# Patient Record
Sex: Female | Born: 1970 | Race: White | Hispanic: No | Marital: Married | Smoking: Never smoker
Health system: Southern US, Community
[De-identification: ages and names within clinical notes are randomized; demographics above are authoritative.]

## PROBLEM LIST (undated history)

## (undated) DIAGNOSIS — F32A Depression, unspecified: Secondary | ICD-10-CM

## (undated) DIAGNOSIS — R011 Cardiac murmur, unspecified: Secondary | ICD-10-CM

## (undated) DIAGNOSIS — T8859XA Other complications of anesthesia, initial encounter: Secondary | ICD-10-CM

## (undated) DIAGNOSIS — F329 Major depressive disorder, single episode, unspecified: Secondary | ICD-10-CM

## (undated) DIAGNOSIS — D61818 Other pancytopenia: Principal | ICD-10-CM

## (undated) DIAGNOSIS — G610 Guillain-Barre syndrome: Secondary | ICD-10-CM

## (undated) DIAGNOSIS — T4145XA Adverse effect of unspecified anesthetic, initial encounter: Secondary | ICD-10-CM

## (undated) HISTORY — DX: Major depressive disorder, single episode, unspecified: F32.9

## (undated) HISTORY — DX: Other pancytopenia: D61.818

## (undated) HISTORY — DX: Guillain-Barre syndrome: G61.0

## (undated) HISTORY — PX: TUBAL LIGATION: SHX77

## (undated) HISTORY — DX: Depression, unspecified: F32.A

## (undated) HISTORY — PX: WISDOM TOOTH EXTRACTION: SHX21

## (undated) HISTORY — PX: OTHER SURGICAL HISTORY: SHX169

## (undated) HISTORY — DX: Cardiac murmur, unspecified: R01.1

---

## 1998-04-18 ENCOUNTER — Encounter: Payer: Self-pay | Admitting: Internal Medicine

## 1999-06-19 ENCOUNTER — Encounter: Payer: Self-pay | Admitting: Neurological Surgery

## 1999-06-19 ENCOUNTER — Ambulatory Visit (HOSPITAL_COMMUNITY): Admission: RE | Admit: 1999-06-19 | Discharge: 1999-06-19 | Payer: Self-pay | Admitting: Neurological Surgery

## 2000-04-03 ENCOUNTER — Other Ambulatory Visit: Admission: RE | Admit: 2000-04-03 | Discharge: 2000-04-03 | Payer: Self-pay | Admitting: *Deleted

## 2000-10-20 ENCOUNTER — Inpatient Hospital Stay (HOSPITAL_COMMUNITY): Admission: AD | Admit: 2000-10-20 | Discharge: 2000-10-24 | Payer: Self-pay | Admitting: Obstetrics and Gynecology

## 2000-10-20 ENCOUNTER — Encounter (INDEPENDENT_AMBULATORY_CARE_PROVIDER_SITE_OTHER): Payer: Self-pay | Admitting: Specialist

## 2001-03-31 ENCOUNTER — Encounter: Payer: Self-pay | Admitting: Internal Medicine

## 2002-01-11 ENCOUNTER — Other Ambulatory Visit: Admission: RE | Admit: 2002-01-11 | Discharge: 2002-01-11 | Payer: Self-pay | Admitting: Obstetrics and Gynecology

## 2002-11-05 ENCOUNTER — Encounter: Payer: Self-pay | Admitting: Internal Medicine

## 2004-06-12 ENCOUNTER — Other Ambulatory Visit: Admission: RE | Admit: 2004-06-12 | Discharge: 2004-06-12 | Payer: Self-pay | Admitting: Obstetrics and Gynecology

## 2004-07-26 ENCOUNTER — Ambulatory Visit (HOSPITAL_COMMUNITY): Admission: RE | Admit: 2004-07-26 | Discharge: 2004-07-26 | Payer: Self-pay | Admitting: Obstetrics and Gynecology

## 2004-08-21 ENCOUNTER — Ambulatory Visit (HOSPITAL_COMMUNITY): Admission: RE | Admit: 2004-08-21 | Discharge: 2004-08-21 | Payer: Self-pay | Admitting: Obstetrics and Gynecology

## 2004-12-17 IMAGING — US US AMNIOCENTESIS
1 series · 4 of 4 positions shown · non-contrast
Comparison: none

[Series 1: us amniocentesis · 4 of 4 slices shown]
[im 1/4]
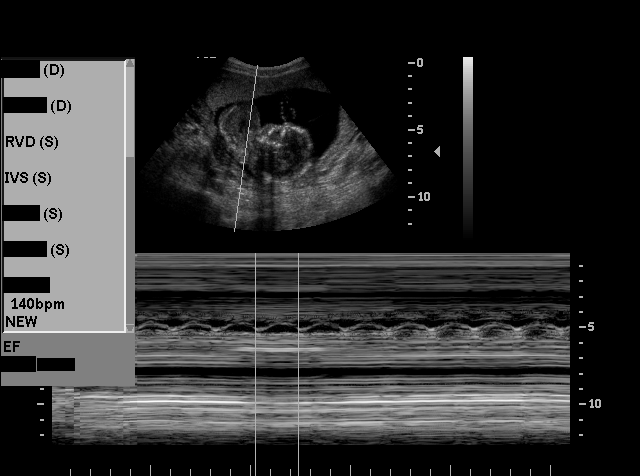
[im 2/4]
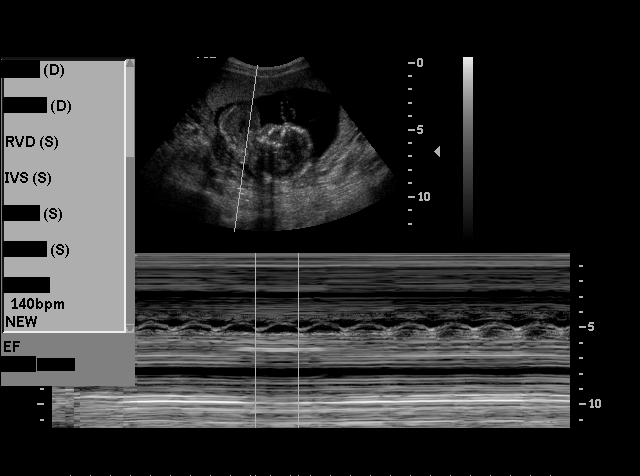
[im 3/4]
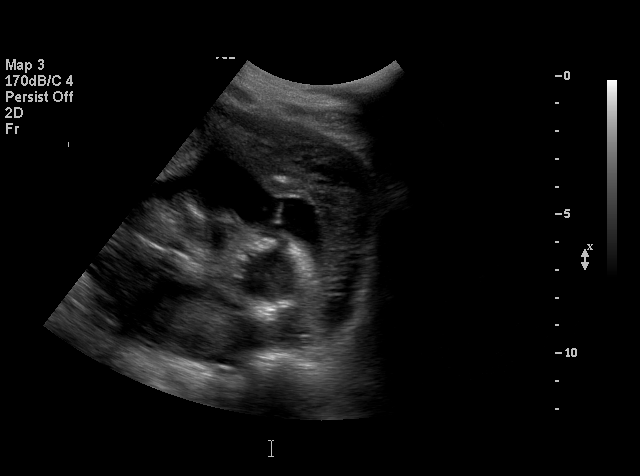
[im 4/4]
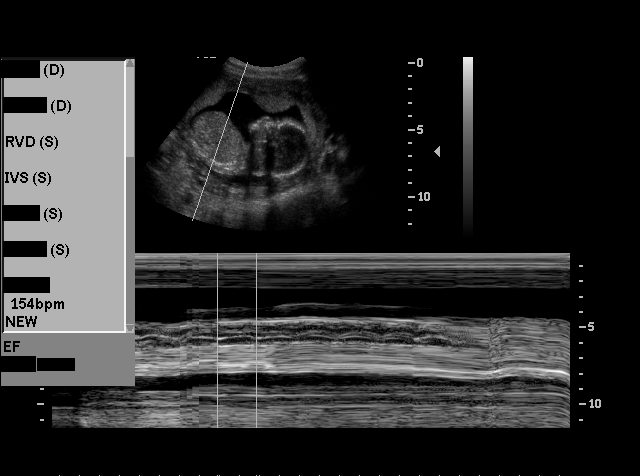

[4 of 4 positions shown; findings below may reference images not displayed]

ULTRASOUND AMNIOCENTESIS
 Ultrasound was utilized to perform amniocentesis by the requesting physician.

## 2004-12-20 ENCOUNTER — Inpatient Hospital Stay (HOSPITAL_COMMUNITY): Admission: AD | Admit: 2004-12-20 | Discharge: 2004-12-23 | Payer: Self-pay | Admitting: Obstetrics and Gynecology

## 2004-12-20 ENCOUNTER — Encounter (INDEPENDENT_AMBULATORY_CARE_PROVIDER_SITE_OTHER): Payer: Self-pay | Admitting: *Deleted

## 2005-01-12 IMAGING — US US OB DETAIL+14 WK
1 series · 13 of 28 positions shown · non-contrast
Comparison: none

CLINICAL DATA: Advanced maternal age.

[Series 1: unknown · 0.30mm/px · 13 of 86 slices shown]
[im 4/86]
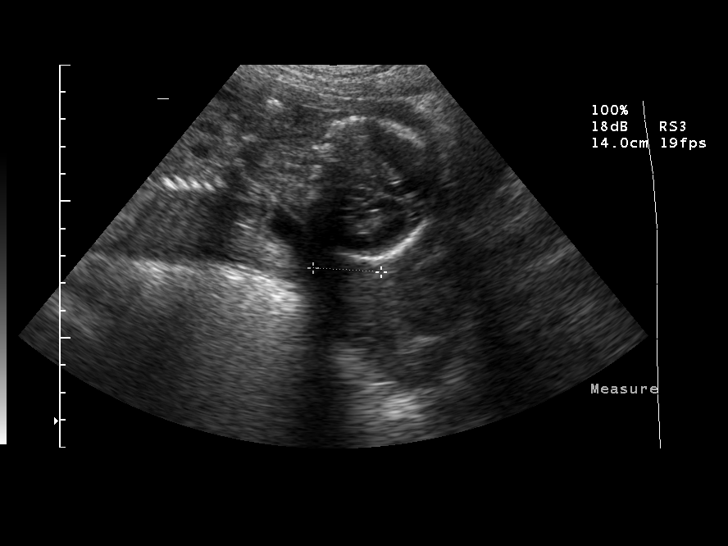
[im 10/86]
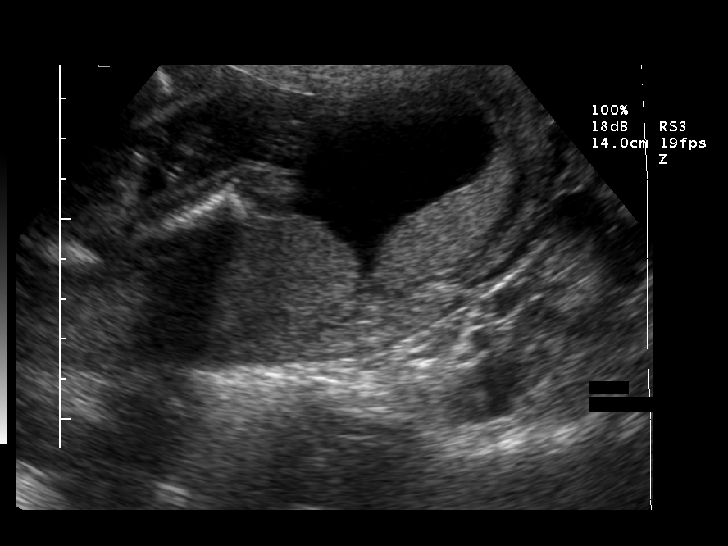
[im 16/86]
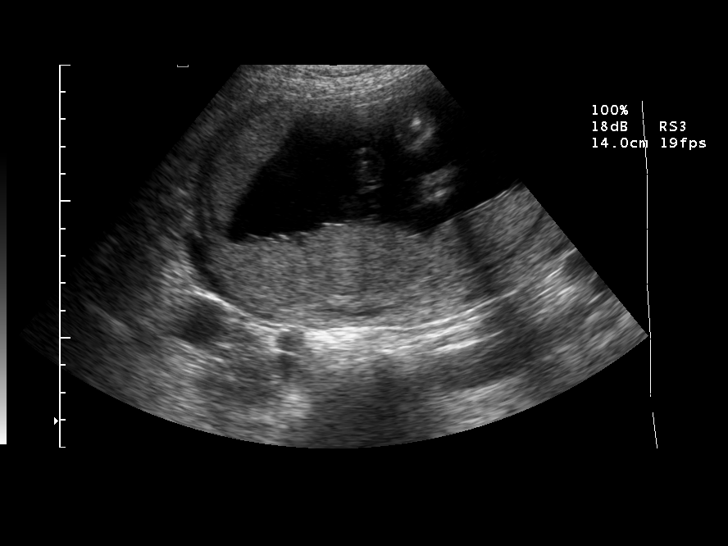
[im 23/86]
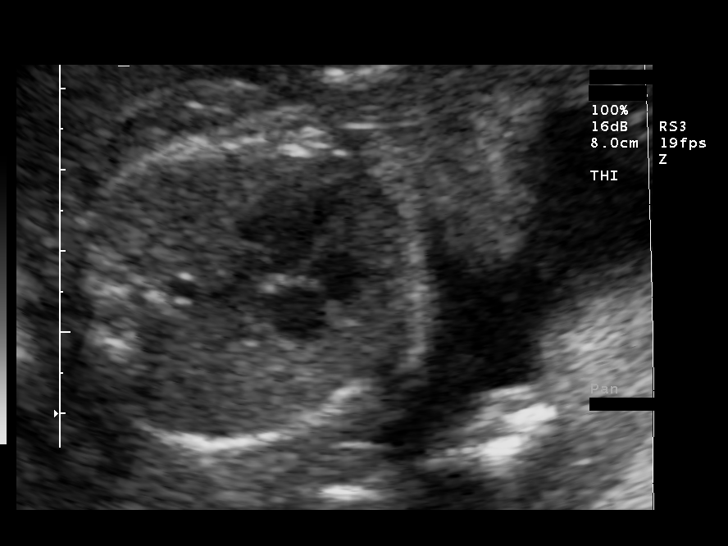
[im 29/86]
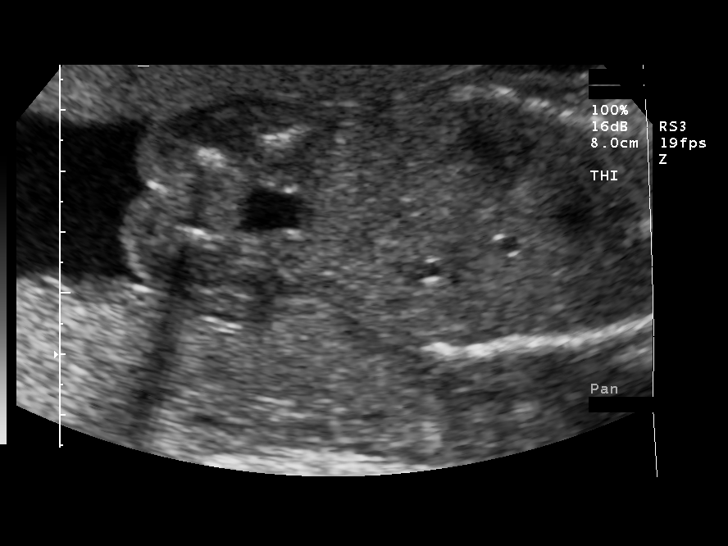
[im 35/86]
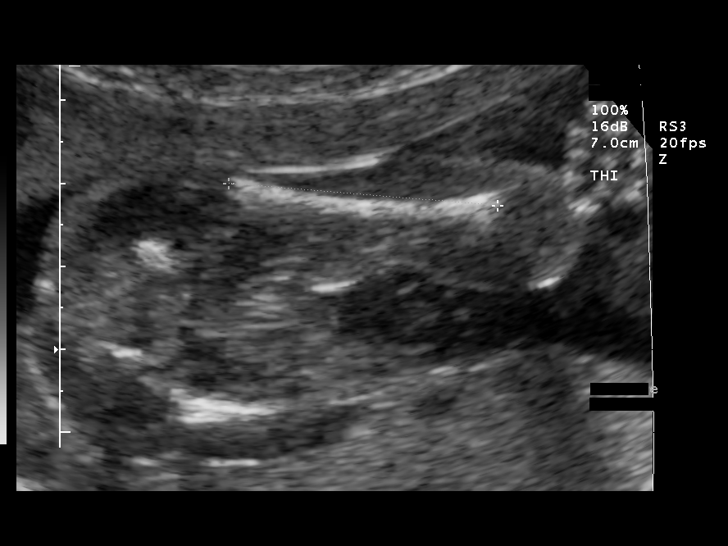
[im 45/86]
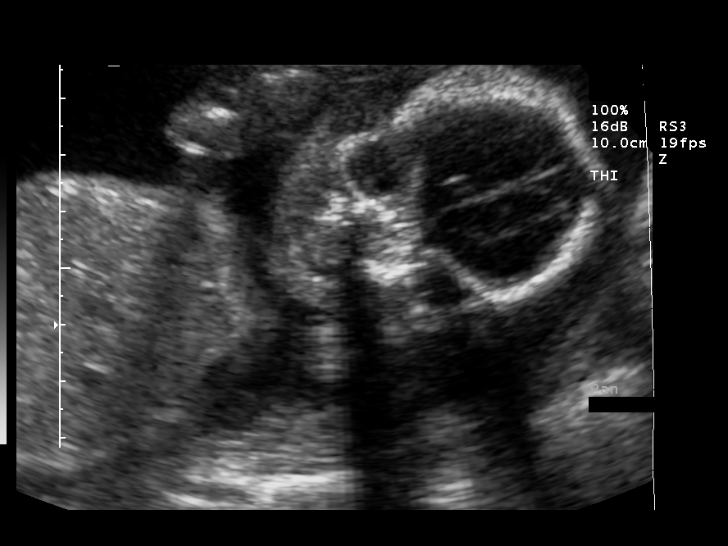
[im 51/86]
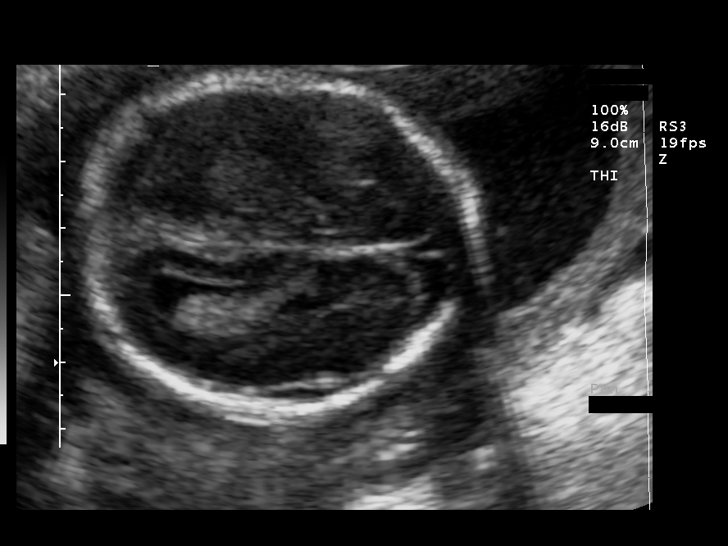
[im 57/86]
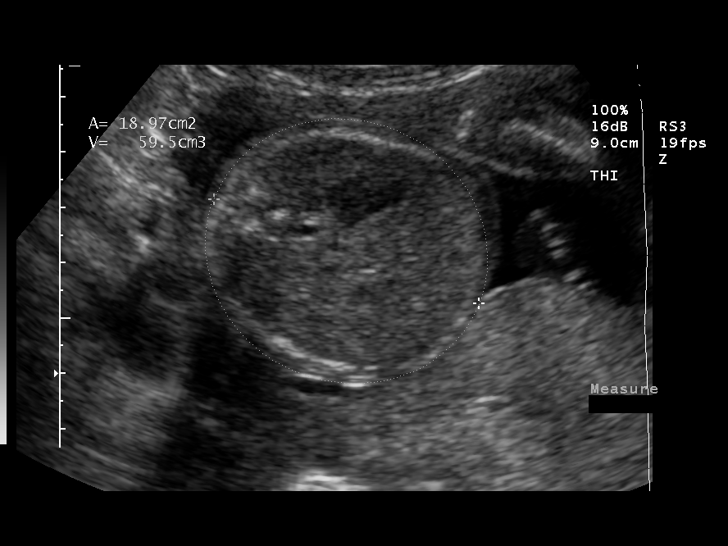
[im 63/86]
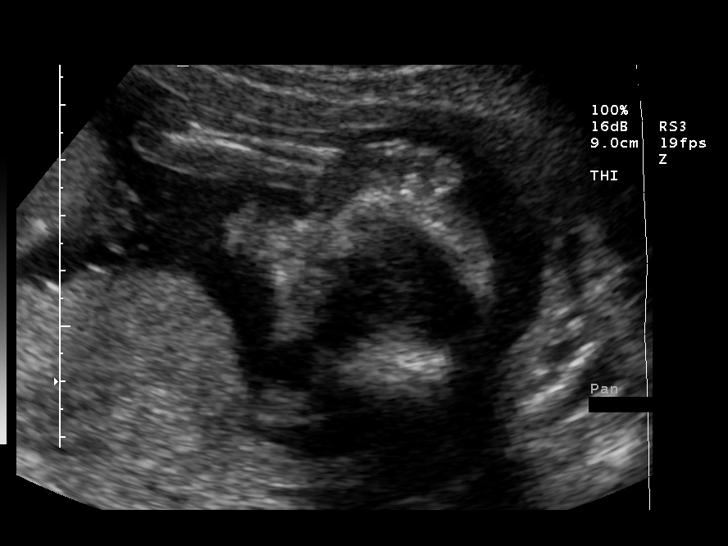
[im 70/86]
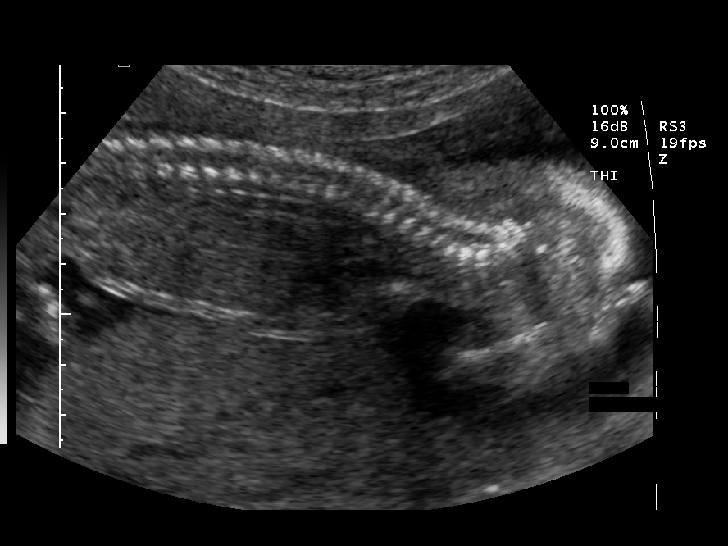
[im 76/86]
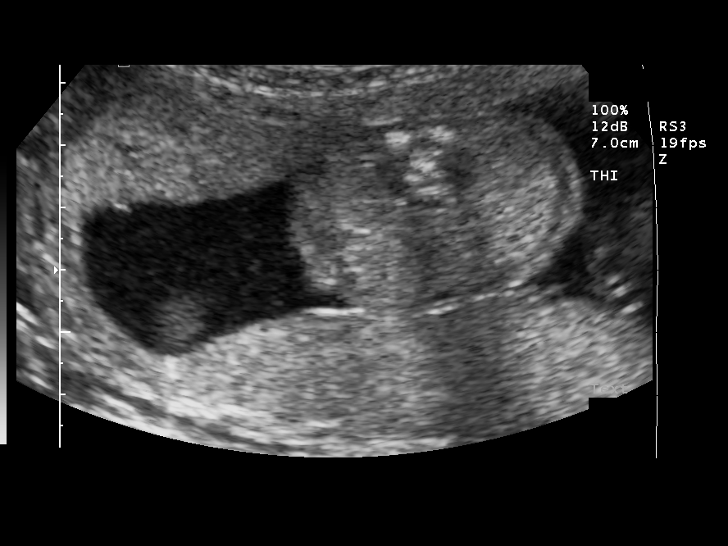
[im 82/86]
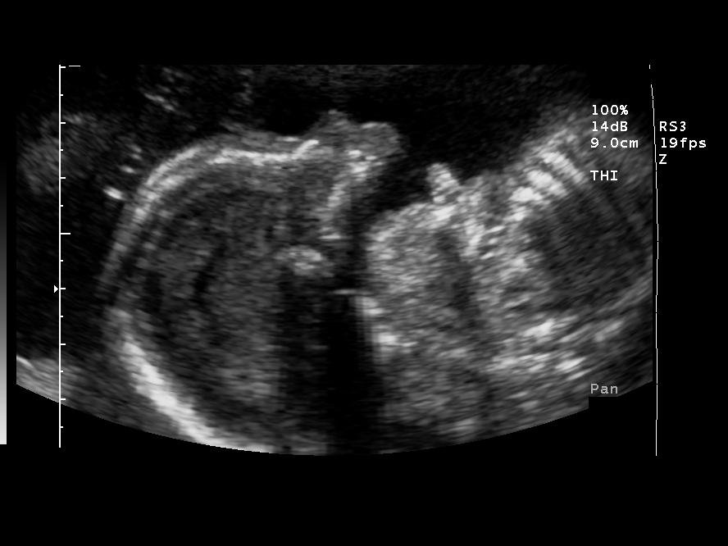

[13 of 28 positions shown; findings below may reference images not displayed]

DETAILED OBSTETRICAL ULTRASOUND 

Number of Fetuses:  1
Heart Rate:  150
Movement:  Yes
Breathing:  No
Presentation:  Cephalic
Placental Location:  Posterior
Grade:  I
Previa:  No
Amniotic Fluid (Subjective):  Normal
Amniotic Fluid (Objective):  4.4 cm Vertical pocket 

FETAL BIOMETRY
BPD:  4.9 cm   21 w 0 d
HC:  18.1 cm   20 w 4 d
AC:  15.5 cm   20 w 5 d
FL:  3.3 cm   20 w 1 d
HL:  3.1 cm   20 w 3 d

MEAN GA:  20 w 4 d

FETAL ANATOMY
Lateral Ventricles:  Visualized 
Thalami/CSP:  Visualized 
Posterior Fossa:  Visualized 
Nuchal Region:  Visualized 
Spine:  Visualized 
4 Chamber Heart on Left:  Visualized 
Stomach on Left:  Visualized 
3 Vessel Cord:  Visualized 
Cord Insertion Site:  Visualized 
Kidneys:  Visualized 
Bladder:  Visualized 
Extremities:  Visualized 

ADDITIONAL ANATOMY VISUALIZED:  LVOT, RVOT, upper lip, orbits, diaphragm, heel, 5th digit, ductal arch, male genitalia and nasal bone.

MATERNAL FINDINGS
Cervix:  3.4 cm Transabdominally
IMPRESSION: Single living intrauterine fetus in cephalic presentation with subjectively normal amniotic fluid volume.  Mean gestational age by today?s ultrasound is 20 weeks 4 days which correlates closely with the reported assigned gestational age by LMP.
Discrete placental lobe noted along the left side of the uterus, consistent with a succinturiate lobe.
Visualized fetal anatomy is unremarkable although a discrete profile view could not be obtained secondary to the fetal chin being against the chest.  The nasal bone was visualized.  

</u12:p>

## 2005-06-05 ENCOUNTER — Ambulatory Visit (HOSPITAL_COMMUNITY): Admission: RE | Admit: 2005-06-05 | Discharge: 2005-06-05 | Payer: Self-pay | Admitting: Obstetrics and Gynecology

## 2005-07-25 ENCOUNTER — Other Ambulatory Visit: Admission: RE | Admit: 2005-07-25 | Discharge: 2005-07-25 | Payer: Self-pay | Admitting: Obstetrics and Gynecology

## 2006-08-14 ENCOUNTER — Other Ambulatory Visit: Admission: RE | Admit: 2006-08-14 | Discharge: 2006-08-14 | Payer: Self-pay | Admitting: Obstetrics and Gynecology

## 2007-04-17 ENCOUNTER — Ambulatory Visit: Payer: Self-pay | Admitting: Internal Medicine

## 2007-08-13 DIAGNOSIS — Z8669 Personal history of other diseases of the nervous system and sense organs: Secondary | ICD-10-CM | POA: Insufficient documentation

## 2007-12-31 LAB — CONVERTED CEMR LAB: Pap Smear: NORMAL

## 2008-08-03 ENCOUNTER — Ambulatory Visit: Payer: Self-pay | Admitting: Internal Medicine

## 2008-08-03 LAB — CONVERTED CEMR LAB
ALT: 41 units/L — ABNORMAL HIGH (ref 0–35)
AST: 30 units/L (ref 0–37)
Albumin: 4 g/dL (ref 3.5–5.2)
Alkaline Phosphatase: 42 units/L (ref 39–117)
BUN: 12 mg/dL (ref 6–23)
Basophils Absolute: 0 10*3/uL (ref 0.0–0.1)
Basophils Relative: 1.1 % (ref 0.0–3.0)
Bilirubin Urine: NEGATIVE
Bilirubin, Direct: 0.1 mg/dL (ref 0.0–0.3)
Blood in Urine, dipstick: NEGATIVE
CO2: 29 meq/L (ref 19–32)
Calcium: 9.1 mg/dL (ref 8.4–10.5)
Chloride: 105 meq/L (ref 96–112)
Cholesterol: 132 mg/dL (ref 0–200)
Creatinine, Ser: 0.7 mg/dL (ref 0.4–1.2)
Eosinophils Absolute: 0.1 10*3/uL (ref 0.0–0.7)
Eosinophils Relative: 1.9 % (ref 0.0–5.0)
GFR calc Af Amer: 120 mL/min
GFR calc non Af Amer: 99 mL/min
Glucose, Bld: 84 mg/dL (ref 70–99)
Glucose, Urine, Semiquant: NEGATIVE
HCT: 37.7 % (ref 36.0–46.0)
HDL: 41.4 mg/dL (ref >39.0)
Hemoglobin: 13 g/dL (ref 12.0–15.0)
Ketones, urine, test strip: NEGATIVE
LDL Cholesterol: 80 mg/dL (ref 0–99)
Lymphocytes Relative: 35.7 % (ref 12.0–46.0)
MCHC: 34.5 g/dL (ref 30.0–36.0)
MCV: 94.5 fL (ref 78.0–100.0)
Monocytes Absolute: 0.4 10*3/uL (ref 0.1–1.0)
Monocytes Relative: 10.7 % (ref 3.0–12.0)
Neutro Abs: 1.9 10*3/uL (ref 1.4–7.7)
Neutrophils Relative %: 50.6 % (ref 43.0–77.0)
Nitrite: NEGATIVE
Platelets: 183 10*3/uL (ref 150–400)
Potassium: 4.1 meq/L (ref 3.5–5.1)
Protein, U semiquant: NEGATIVE
RBC: 3.99 M/uL (ref 3.87–5.11)
RDW: 12.2 % (ref 11.5–14.6)
Sodium: 141 meq/L (ref 135–145)
Specific Gravity, Urine: 1.01
TSH: 3.85 microintl units/mL (ref 0.35–5.50)
Total Bilirubin: 0.9 mg/dL (ref 0.3–1.2)
Total CHOL/HDL Ratio: 3.2
Total Protein: 7 g/dL (ref 6.0–8.3)
Triglycerides: 52 mg/dL (ref 0–149)
Urobilinogen, UA: 0.2
VLDL: 10 mg/dL (ref 0–40)
WBC Urine, dipstick: NEGATIVE
WBC: 3.7 10*3/uL — ABNORMAL LOW (ref 4.5–10.5)
pH: 5.5

## 2008-08-17 ENCOUNTER — Ambulatory Visit: Payer: Self-pay | Admitting: Internal Medicine

## 2008-08-17 DIAGNOSIS — F329 Major depressive disorder, single episode, unspecified: Secondary | ICD-10-CM | POA: Insufficient documentation

## 2010-09-11 ENCOUNTER — Ambulatory Visit (HOSPITAL_COMMUNITY): Admission: RE | Admit: 2010-09-11 | Discharge: 2010-09-11 | Payer: Self-pay | Admitting: Obstetrics and Gynecology

## 2011-01-31 NOTE — Assessment & Plan Note (Signed)
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  History of Present Illness: feels well no complaints has had some depression---meds working    Prior Medications Reviewed Using: Patient Recall  Updated Prior Medication List: MIRTAZAPINE 30 MG  TABS (MIRTAZAPINE) Take 1 tablet by mouth once a day  Current Allergies (reviewed today): No known allergies   Past Medical History:    guillan Barre'    Depression  Past Surgical History:    Caesarean section    Schwanoma   Family History:    Family History of Melanoma--Dad    father-alive    mother- alive and well      Social History:    Married    Never Smoked    2 kids healthy        Occupation:---homemaker   Risk Factors:  PAP Smear History:     Date of Last PAP Smear:  12/31/2007    Results:  normal-pt's report    Review of Systems       no other complaints in a complete ROS      Impression & Recommendations:  Problem # 1:  PREVENTIVE HEALTH CARE (ICD-V70.0) helath maintenance UTD  Complete Medication List: 1)  Mirtazapine 30 Mg Tabs (Mirtazapine) .... Take 1 tablet by mouth once a day    ]Physical Exam General Appearance: well developed, well nourished, no acute distress Eyes: conjunctiva and lids normal, PERRL, EOMI,  Ears, Nose, Mouth, Throat: TM clear, nares clear, oral exam WNL Neck: supple, no lymphadenopathy, no thyromegaly, no JVD Respiratory: clear to  auscultation and percussion, respiratory effort normal Cardiovascular: regular rate and rhythm, S1-S2, no murmur, rub or gallop, no bruits, peripheral pulses normal and symmetric, no cyanosis, clubbing, edema or varicosities Chest: no scars, masses, tenderness; no asymmetry, skin changes, nipple discharge   Gastrointestinal: soft, non-tender; no hepatosplenomegaly, masses; active bowel sounds all quadrants,  Lymphatic: no cervical, axillary or inguinal adenopathy Musculoskeletal: gait normal, muscle tone and strength WNL, no joint swelling, effusions, discoloration, crepitus  Skin: clear, good turgor, color WNL, no rashes, lesions, or ulcerations Neurologic: normal mental status, normal reflexes, normal strength, sensation, and motion Psychiatric: alert; oriented to person, place and time Other Exam:

## 2011-05-17 NOTE — Discharge Summary (Signed)
[facility] [place]  Patient:    Makayla Taylor, Makayla Taylor                        MRN: [id number] Adm. Date:  [id number] Disc. Date: 10/24/00 Attending:  Cleatrice Burke DictatorVance Gather Duplantis, C.N.M.                           Discharge Summary  ADMISSION DIAGNOSES:          1. Intrauterine pregnancy at term.                               2. Spontaneous rupture of membranes.                               3. Early labor.                               4. Group B streptococcus negative.                               5. Rubella nonimmune.  DISCHARGE DIAGNOSES:          1. Intrauterine pregnancy at term.                               2. Spontaneous rupture of membranes.                               3. Early labor.                               4. Group B streptococcus negative.                               5. Rubella nonimmune.                               6. Breast feeding.                               7. Status post arrested second stage of labor                                  with successful vacuum-assisted delivery.                               8. Postpartum hemorrhage.                               9. Anemia.  PROCEDURES THIS ADMISSION:    Vacuum-assisted vaginal delivery for delivery of a viable female infant named Sherron Monday who weighed 9 pounds 1 ounce and had Apgars of 8 and 9 on October 22, 2000.  She was  delivered by Janine Limbo, M.D.  HOSPITAL COURSE:              Ms. Kurka is a 40 year old, married, white female, gravida 1, para 0, at 39-3/7 weeks, who presented with spontaneous rupture of membranes with clear fluid and minimal labor.  She eventually was started on Pitocin augmentation of labor and progressed slowly to completely dilated after about 24 hours of Pitocin.  She then started pushing and pushed for at least three hours and had brought the vertex down to about a +2 station whereupon she was exhausted and the options of vacuum  assistance versus forceps were reviewed with the patient.  Janine Limbo, M.D., was consulted to assist the patient and recommended proceeding with a vacuum-assisted delivery, which the patient and husband agreed to do.  She was then delivered by Janine Limbo, M.D., on October 22, 2000, of a viable female infant named Sherron Monday who weighed 9 pounds 1 ounce and had Apgars of 8 and 9.  Please see the delivery note for details.  Postoperatively, she has done well.  She is ambulating, voiding, and eating without difficulty.  She has no syncope or dizziness.  She is breast-feeding without difficulty.  She is undecided regarding contraception at this time.  DISPOSITION:                  She is deemed ready for discharge today.  DISCHARGE INSTRUCTIONS:       As per the [facility] handout.  DISCHARGE MEDICATIONS:        1. Motrin 600 mg p.o. q.6h. p.r.n. for pain.                               2. Hemocyte one p.o. b.i.d.                               3. Tylox one to two p.o. q.4-6h. p.r.n. for                                  pain.  DISCHARGE FOLLOW-UP:          Will be at [facility] in approximately six weeks or p.r.n.  DISCHARGE LABORATORIES:       Her hemoglobin is 6.8, her platelets are 104, and her WBC count if 15.2. DD:  10/24/00 TD:  10/24/00 Job: [id number]

## 2011-05-17 NOTE — Op Note (Signed)
[facility] [place]  Patient:    Makayla Taylor, Makayla Taylor                        MRN: [id number] Proc. Date: 10/22/00 Adm. Date:  [id number] Attending:  Cleatrice Burke                           Operative Report  PREOPERATIVE DIAGNOSIS:       1. Term intrauterine pregnancy.                               2. Arrested second stage of labor.  POSTOPERATIVE DIAGNOSIS:      1. Term intrauterine pregnancy.                               2. Arrested second stage of labor.                               3. Nuchal cord.                               4. Partial third degree midline laceration.                               5. Second degree right sulcus laceration.                               6. Postpartum hemorrhage.                               7. Hypotension.  OPERATION:                    1. Vacuum extraction vaginal delivery.                               2. Repair of third degree midline laceration.                               3. Repair of second degree right sulcus                                  laceration  OBSTETRICIAN:                 Janine Limbo, M.D.  FIRST ASSISTANT:              Miguel Dibble, C.N.M.  ANESTHESIA:                   Epidural and local Xylocaine.  DISPOSITION:                  [name] is a 40 year old female, gravida 1, para 0, who presented to labor and delivery on October 20, 2000, with premature rupture of membranes and early labor.  The patient had a very slow progression of her labor.  Her labor was augmented.  The patient became complete dilated on the morning of October 22, 2000.  The patient pushed for greater than 2 hours.  She began to tire because of her long labor and from pushing.  I reviewed the options for management with the patient which included observation only, continued pushing, operative vaginal delivery, and cesarean delivery.  The risks and benefits of each of those options were discussed.  The patient  and her husband elected to proceed with vacuum extraction vaginal delivery.  The specific risks of vacuum extraction vaginal delivery were reviewed including, but not limited to, caput formation, hematoma formation, and the rare risk of intracranial bleeding.  We also discussed the fact that it was possible that the vacuum extraction would not be successful and that we would still need to proceed with cesarean delivery.  FINDINGS:                     The weight of the infant is currently not known. A female infant was delivered, named Alan Ripper, with Apgars at 8 at 1 minute and 9 at 5 minutes.  There was a partial third degree midline laceration noted. There was a second degree right sulcus laceration that extended to within 4 cm of the cervix.  The patient had a significant amount of uterine bleeding following her procedure.  There was a nuchal cord present.  The placenta had an accessory lobe.  DESCRIPTION OF PROCEDURE:     The patient was placed in a lithotomy position. The perineum and vagina were prepped with Betadine.  The patient had emptied her bladder while she pushed.  Therefore, the bladder was not catheterized. The fetal head was at a +2 station.  The orientation was occiput anterior. The cervix was completely dilated and 100% effaced.  The Kiwi vacuum extractor was applied.  The patient was allowed to push, and she delivered the fetal head. No popoffs occurred.  The mouth and nose were suction.  A nuchal cord was present.  The cord was reduced.  The remainder of the infant was then delivered.  The cord was clamped and cut.  The infant was placed on the mothers abdomen for bonding.  The patient was noted to have significant bleeding following delivery of the baby.  The placenta was then delivered. The fundus was massaged, and bleeding was noted to slow down.  It did not completely resolve, however.  The patient was given 1 ampule of Hemabate (15-methyl prostaglandin F2a).   Several additional episodes of uterine massage was needed before the uterus would stop bleeding.  The upper vagina was inspected, and the patient was noted to have a second degree sulcus laceration on the right.  An assistant helped with retraction of the vagina so that we could visualize the lacerations.  The right sulcus laceration was repaired using a running locking suture of 2-0 Vicryl.  The capsule of the sphincter ani was then repaired using figure-of-eight sutures of 2-0 Vicryl.  The support fascia of the perineum was then reinforced using figure-of-eight sutures of 2-0 Vicryl.  The vaginal laceration was then repaired using a running suture of 3-0 Vicryl.  The placenta was sent to pathology for evaluation.  The patient was told that she will need assistance as she ambulates. We will check a hematocrit and hemoglobin at 3 oclock. We will obtain orthostatic blood pressures and pulses.  The patient and her husband understand that she will need assistance as she ambulates.  She  understands that we may need to transfuse her if she is unable to care for herself or her baby.  The risks and benefits of blood transfusion were reviewed including the possible risk of infection.  They understand that that is extremely unlikely. Estimated blood loss was 1000 cc. DD:  10/22/00 TD:  10/22/00 Job: [id number]

## 2011-05-17 NOTE — Discharge Summary (Signed)
NAME:  Makayla Taylor, Makayla Taylor NO.:  [id number]   MEDICAL RECORD NO.:  [id number]          PATIENT TYPE:  INP   LOCATION:  9118                          FACILITY:  [facility]   PHYSICIAN:  Naima A. Dillard, M.D. DATE OF BIRTH:  08-26-1971   DATE OF ADMISSION:  12/20/2004  DATE OF DISCHARGE:  12/23/2004                                 DISCHARGE SUMMARY   ADMISSION DIAGNOSIS:  Intrauterine pregnancy at 37-1/7 weeks.  Premature  rupture of membranes in labor, breech presentation.  Desires sterilization.   PROCEDURE:  Primary low transverse cesarean section and BTL.   DISCHARGE DIAGNOSIS:  All of the above plus postpartum hemorrhage.   HISTORY OF PRESENT ILLNESS:  Makayla Taylor is a 40 year old gravida 2 para 1 who  presented at 37-1/7 weeks with spontaneous rupture of membranes with clear  fluid, regular contractions in early labor with a breech presentation.  She  desired sterilization.  The risks and benefits of primary low transverse  cesarean section and a BTL were discussed with  her by Dr. Dierdre Forth  who then performed primary low transverse cesarean section and BTL on  12/20/04 with the birth of a 7 pound female infant with Apgar scars of 9 at 1  minute and 9 at 5 minutes.  The patient has done well in postoperative  period.  Her hemoglobin on the first postoperative day was 9.5, and she has  remained stable.  Her baby has done well and on this her third postoperative  day she is deemed to be in satisfactory condition for discharge.   Her incision is clean, dry, and intact at the time of discharge.   DISCHARGE INSTRUCTIONS:  Per [facility] handout.   DISCHARGE MEDICATIONS:  1.  Motrin 600 mg p.o. q.6h. p.r.n. pain.  2. Tylox 1-2 p.o. q.3-4h p.r.n.      pain.  3. Prenatal vitamins.   FOLLOW UP:  Discharge followup will be at [facility] in six weeks.     Pecolia Ades   SDM/MEDQ  D:  12/23/2004  T:  12/24/2004  Job:  [id number]

## 2011-05-17 NOTE — Op Note (Signed)
NAME:  Makayla Taylor, Makayla Taylor                           ACCOUNT NO.:  [id number]   MEDICAL RECORD NO.:  [id number]                   PATIENT TYPE:  OUT   LOCATION:  ULT                                  [facility]ILITY:  [facility]   PHYSICIAN:  Hal Morales, M.D.             DATE OF BIRTH:  [birth year]   DATE OF PROCEDURE:  07/26/2004  DATE OF DISCHARGE:                                 OPERATIVE REPORT   PREOPERATIVE DIAGNOSES:  Maternal age of 70, intrauterine pregnancy at 16  weeks.   POSTOPERATIVE DIAGNOSES:  Maternal age of 69, intrauterine pregnancy at 16  weeks.   OPERATION:  Genetic amniocentesis.   ANESTHESIA:  Local.   ESTIMATED BLOOD LOSS:  Less than 10 mL.   COMPLICATIONS:  None.   FINDINGS:  The patient had a posterior placenta and normal amniotic fluid.  The fetus was in variable position. The blood type is O positive.   DESCRIPTION OF PROCEDURE:  The patient was in the supine position on the  examining table of the ultrasound unit.  A preliminary ultrasound was  performed to document adequacy of fluid, position of placenta and position  of the fetus.  An area in the suprapubic region was identified to be free of  fetal part and with normal amniotic fluid. The area was prepped with  multiple layers of Betadine and draped. The area was infiltrated with 1%  Xylocaine.  A 20 gauge Ultraview needle was used to access the amniotic  fluid pocket on the second placement under ultrasound guidance. Blood tinged  fluid was withdrawn which began to clear with the second syringe. 5 mL of  fluid was drawn into the first syringe and then 10 mL into the second  syringe. The placement of the amniocentesis needle was documented. The  amniocentesis needle was removed and the post amniocentesis heart rate was  in the 150 beat per minute range. The patient tolerated the procedure well  and was discharged home with printed instructions from the [facility]  OB[facility]N division of [facility].  The amniotic fluid was  sent to [facility] for analysis.                                               Hal Morales, M.D.    VPH/MEDQ  D:  07/26/2004  T:  07/26/2004  Job:  [id number]

## 2011-05-17 NOTE — H&P (Signed)
NAME:  LITTLE, BASHORE NO.:  [id number]   MEDICAL RECORD NO.:  [id number]          PATIENT TYPE:  INP   LOCATION:  9118                          [facility]ILITY[facility]  [facility]   PHYSICIAN:  Hal Morales, M.D.DATE OF BIRTH:  12-25-1971   DATE OF ADMISSION:  12/20/2004  DATE OF DISCHARGE:                                HISTORY & PHYSICAL   HISTORY OF PRESENT ILLNESS:  Ms. Laury is a 40 year old gravida 2 para 1-0-0-  1 at 37-1/2 weeks who presents with spontaneous rupture of membranes at  approximately 3:30 p.m. with contractions approximately every 3-4 minutes.  Patient has been breech on her last two ultrasounds.  She is also now noted  to be breech on bedside ultrasound.  Fluid leaking is noted and there is  light meconium-stained fluid noted.  Patient is therefore admitted to [facility] for cesarean section secondary to breech in labor.  Pregnancy has been remarkable for (1) postpartum hemorrhage after her last  delivery with succenturiate lobe noted during that pregnancy, (2) mild  sensitivity to Latex gloves, (3) history of postpartum depression, (4)  advanced maternal age with normal amnio, (5) history of macrosomia, (6) had  Guillain-Barre syndrome in 1999 but was resolved, (7) breech on last  ultrasound.   PRENATAL LABORATORIES:  Blood type is O positive, Rh antibody negative.  VDRL nonreactive.  Rubella titer positive.  Hepatitis B surface antigen  negative.  Cystic fibrosis testing was negative.  HIV was nonreactive.  Hemoglobin upon entering the practice was 11.9, platelet count was 153 at  her new OB visit.  Group B strep culture was negative at 36 weeks.  Pap  smear was normal.  EDC of January 06, 2005 was established by last menstrual  period and is in agreement with ultrasound at approximately 18 weeks.   HISTORY OF PRESENT PREGNANCY:  Patient entered care at approximately 10  weeks.  She had an amniocentesis by Dr. Pennie Rushing that showed  normal female  chromosomes.  She had another ultrasound at 20 weeks showing normal growth  and development.  She had a succenturiate lobe noted on that ultrasound.  CBC was done at 26 weeks showing platelets of 134.  She had another  ultrasound at 31 weeks for size less than dates, growth was at the 79th-80th  percentile, and breech presentation.  She another ultrasound at 36 weeks  showing breech again.  She had elected not to proceed with an external  version but to plan a C-section with a tubal should the baby remain breech.   OBSTETRICAL HISTORY:  In 2001 she had a vacuum assisted vaginal birth of a  female infant,weight 9 pounds 1 ounce at 40 weeks.  She was in labor 72  hours.  She had epidural anesthesia.  There was an extra lobe on the  placenta and had increased bleeding, her hemoglobin was 6 after delivery.  She had some postpartum depression after her delivery but required no  medications.   MEDICAL HISTORY:  She stopped Ortho-Novum 1/35 one year ago.  She reports  the usual childhood  illnesses, occasional yeast infection, she has  superficial varicosities.  She had transfusion at age 61 or 1 during surgery  for removal of a cavernous hemangioma and she had another blood transfusion  when she was a teenager but this was an autologous transfusion.  Surgical  history includes at age 61 a cavernous hemangioma removed.  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  She also had Guillain-  Barre syndrome in 1999 which resolved.  Patient has some mild sensitivity to  LATEX GLOVES on exams.  She denies any alcohol, drug, or tobacco use during  this pregnancy.   SOCIAL HISTORY:  Patient is Caucasian of the [place].  She is  married to the father of the baby.  His name is Barnie Del.  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  Her  husband is graduate educated.  He is a Location manager.  She  has been  followed by the certified nurse midwife service [facility]  with physician consultation.  She denies any alcohol, drug, or tobacco use  during this pregnancy.   PHYSICAL EXAMINATION:  VITAL SIGNS:  Vital signs are stable; patient is  afebrile.  HEENT:  Within normal limits.  LUNGS:  Bilateral breath sounds are clear.  HEART:  Heart regular rate and rhythm without murmur.  BREASTS:  Breasts are soft and nontender.  ABDOMEN:  Fundal height is approximately 38 cm, estimated fetal weight is 7-  8 pounds, uterine contractions are every 3 minutes moderate quality.  STERILE SPECULUM EXAM:  Positive fern, positive pooling, positive Nitrazine.  Cervix is 2 cm, 70%, breech, she is at a -2 station, breech position is  verified by bedside ultrasound with the vertex in the right upper quadrant.  Fetal heart rate is reactive with no decelerations.  EXTREMITIES:  Deep tendon reflexes are 2+ without clonus.  There is a trace  edema noted.   IMPRESSION:  1.  Intrauterine pregnancy at 37-1/7 weeks.  2.  Spontaneous rupture of membranes in labor.  3.  Breech presentation.  4.  Desires sterilization.   PLAN:  1.  Admit to [facility] [place] per consult with Dr. Dierdre Forth as attending physician.  2.  Routine physician preop orders.  3.  Risks and benefits of cesarean were reviewed with the patient and her      husband by Dr. Pennie Rushing including infection, bleeding, damage to other      organs, anesthesia complications and also risks of tubal sterilization      were also reviewed including failure of method.  Patient and her husband      seem to understand these risks and benefits and do wish to proceed.     Vick   VLL/MEDQ  D:  12/22/2004  T:  12/22/2004  Job:  [id number]

## 2011-05-17 NOTE — Op Note (Signed)
NAME:  Makayla Taylor, Makayla Taylor NO.:  [id number]   MEDICAL RECORD NO.:  [id number]          PATIENT TYPE:  INP   LOCATION:  9118                          FACILITY:  [facility]   PHYSICIAN:  Hal Morales, M.D.DATE OF BIRTH:  May 04, 1971   DATE OF PROCEDURE:  12/20/2004  DATE OF DISCHARGE:                                 OPERATIVE REPORT   PREOPERATIVE DIAGNOSES:  1.  Intrauterine pregnancy at 37-1/2 weeks.  2.  Spontaneous rupture of membranes.  3.  Breech presentation.  4.  Desire for surgical sterilization.   POSTOPERATIVE DIAGNOSES:  1.  Intrauterine pregnancy at 37-1/2 weeks.  2.  Spontaneous rupture of membranes.  3.  Breech presentation.  4.  Desire for surgical sterilization.  5.  Immediate postpartum hemorrhage.   OPERATION:  1.  Primary low transverse cesarean section.  2.  Bilateral tubal ligation.   ANESTHESIA:  Epidural.   ESTIMATED BLOOD LOSS:  1200 mL.   COMPLICATIONS:  Immediate postpartum hemorrhage, finally controlled with IM  Methergine.   SURGEON:  Hal Morales, M.D.   FIRST ASSISTANT:  Renaldo Reel. Emilee Hero, C.N.M.   FINDINGS:  The patient was delivered of a female infant weighing 7 pounds with  Apgars of 9 and 9 at one and five minutes, respectively.  The uterus, tubes,  and ovaries appeared normal for the gravid state.   PROCEDURE:  The patient was taken to the operating room after appropriate  identification and placed on the operating table.  After assessment of all  of her laboratory data, an epidural anesthetic was placed and she was placed  in the supine position with a left lateral tilt.  The abdomen and perineum  were prepped with multiple layers of Betadine and a latex-free Foley  catheter inserted into the bladder under sterile conditions and connected to  straight drainage.  The abdomen was draped as a sterile field.  After the  assurance of adequate anesthesia, suprapubic subcutaneous infiltration of 20  mL of 0.25% Marcaine was  undertaken.  A suprapubic incision was made and the  abdomen opened in layers.  The peritoneum was entered and the bladder blade  placed.  The uterus was incised approximately 2 cm above the uterovesical  fold and that incision taken laterally on either side.  The infant was  delivered from right sacrum transverse position and after having the nares  and pharynx suctioned and the cord clamped and cut was handed off to the  awaiting pediatricians.  The appropriate cord blood was drawn and the  placenta allowed to separate from the uterus and was removed from the  operative field.  The uterine cavity was inspected to ensure that all  fragments of placental tissue had been removed.  The uterine incision was  then closed with a running interlocking suture of 0 Vicryl.  An imbricating  suture of 0 Vicryl was then placed.  There continued to be significant blood  loss from the uterine cavity, and the uterus remained quite boggy.  Significant uterine massage was undertaken, as was additional Pitocin.  The  uterus remained boggy,  and 0.2 mg of Methergine was given IM with  improvement in the uterine tone.  Copious irrigation was carried out. The  left fallopian tube was identified, followed to its fimbriated end, then  grasped at the isthmic portion and elevated.  A suture of 2-0 chromic was  placed through the mesosalpinx and tied fore and aft on the knuckle of tube.  A second ligature was placed proximal to that.  The intervening knuckle of  tube was incised and the cut ends cauterized.  A similar procedure was  carried out on the opposite side.  Hemostasis was noted to be adequate.  The  abdominal peritoneum was closed with a running suture of 2-0 Vicryl after a  single suture of 2-0 Vicryl had been placed to reapproximate the bladder  flap.  The rectus muscles were reapproximated in the midline with a figure-  of-eight suture of 2-0 Vicryl.  Copious irrigation was carried out and  hemostasis  noted to be adequate.  The rectus fascia was closed with a  running suture of 0 Vicryl, then reinforced on either side of midline with  figure-of-eight sutures of 0 Vicryl.  The subcutaneous tissue was copiously  irrigated and made hemostatic with Bovie cautery.  Skin staples were applied  to the skin incision and a sterile dressing applied.  The patient was taken  from the operating room to the recovery room in satisfactory condition  having tolerated the procedure well, with sponge and instrument counts  correct.  The infant went to the full-term nursery.     Colin Ina   VPH/MEDQ  D:  12/20/2004  T:  12/21/2004  Job:  [id number]

## 2011-10-01 ENCOUNTER — Other Ambulatory Visit (HOSPITAL_COMMUNITY): Payer: Self-pay | Admitting: Obstetrics and Gynecology

## 2011-10-01 DIAGNOSIS — Z1231 Encounter for screening mammogram for malignant neoplasm of breast: Secondary | ICD-10-CM

## 2011-10-09 ENCOUNTER — Ambulatory Visit (HOSPITAL_COMMUNITY)
Admission: RE | Admit: 2011-10-09 | Discharge: 2011-10-09 | Disposition: A | Discharge status: Home / Self Care | Payer: BC Managed Care – PPO | Source: Ambulatory Visit | Attending: Obstetrics and Gynecology | Admitting: Obstetrics and Gynecology

## 2011-10-09 DIAGNOSIS — Z1231 Encounter for screening mammogram for malignant neoplasm of breast: Secondary | ICD-10-CM | POA: Insufficient documentation

## 2012-01-03 ENCOUNTER — Encounter: Payer: Self-pay | Admitting: Family

## 2012-01-03 ENCOUNTER — Ambulatory Visit (INDEPENDENT_AMBULATORY_CARE_PROVIDER_SITE_OTHER): Payer: BC Managed Care – PPO | Admitting: Family

## 2012-01-03 VITALS — BP 110/60 | Temp 98.9°F | Wt 146.0 lb

## 2012-01-03 DIAGNOSIS — R05 Cough: Secondary | ICD-10-CM

## 2012-01-03 DIAGNOSIS — J069 Acute upper respiratory infection, unspecified: Secondary | ICD-10-CM

## 2012-01-03 DIAGNOSIS — R059 Cough, unspecified: Secondary | ICD-10-CM

## 2012-01-03 MED ORDER — FEXOFENADINE-PSEUDOEPHED ER 180-240 MG PO TB24: 1.0000 | ORAL_TABLET | Freq: Every day | ORAL | Status: DC

## 2012-01-03 MED ORDER — FLUTICASONE PROPIONATE 50 MCG/ACT NA SUSP: 2.0000 | Freq: Every day | NASAL | Status: DC

## 2012-01-03 NOTE — Progress Notes (Signed)
  Subjective:    Patient ID: Makayla Taylor, female    DOB: Mar 05, 1971, 41 y.o.   MRN: [id number]  Cough This is a new problem. The current episode started in the past 7 days. The problem has been unchanged. The cough is productive of sputum. Associated symptoms include nasal congestion, postnasal drip, rhinorrhea and a sore throat. The symptoms are aggravated by lying down. She has tried OTC cough suppressant for the symptoms. The treatment provided no relief.      Review of Systems  Constitutional: Positive for fatigue.  HENT: Positive for congestion, sore throat, rhinorrhea, sneezing, postnasal drip and sinus pressure.   Eyes: Negative.   Respiratory: Positive for cough.   Cardiovascular: Negative.   Musculoskeletal: Negative.   Skin: Negative.   Neurological: Negative.   Hematological: Negative.   Psychiatric/Behavioral: Negative.    Past Medical History  Diagnosis Date  . Depression   . Guillain-Barre syndrome     History   Social History  . Marital Status: Married    Spouse Name: N/A    Number of Children: N/A  . Years of Education: N/A   Occupational History  . Not on file.   Social History Main Topics  . Smoking status: Never Smoker   . Smokeless tobacco: Not on file  . Alcohol Use:   . Drug Use:   . Sexually Active:    Other Topics Concern  . Not on file   Social History Narrative  . No narrative on file    Past Surgical History  Procedure Date  . Cesarean section   . Schwanoma     Family History  Problem Relation Age of Onset  . Melanoma Father     No Known Allergies  No current outpatient prescriptions on file prior to visit.    BP 110/60  Temp(Src) 98.9 F (37.2 C) (Oral)  Wt 146 lb (66.225 kg)chart    Objective:   Physical Exam  Constitutional: She is oriented to person, place, and time. She appears well-developed and well-nourished.  HENT:  Right Ear: External ear normal.  Left Ear: External ear normal.  Nose: Nose normal.    Mouth/Throat: Oropharynx is clear and moist.  Neck: Normal range of motion. Neck supple.  Cardiovascular: Normal rate, regular rhythm and normal heart sounds.   Pulmonary/Chest: Effort normal and breath sounds normal.  Musculoskeletal: Normal range of motion.  Neurological: She is alert and oriented to person, place, and time.  Skin: Skin is warm and dry.  Psychiatric: She has a normal mood and affect.          Assessment & Plan:  Assessment:Upper Respiratory Infection, Cough  Plan: Allegra-D once daily and Flonase 2 sprays in each nostril once daily. Rest. Drink plenty of fluids. Call if symptoms worsen or persist. Recheck PRN.

## 2012-01-03 NOTE — Patient Instructions (Signed)

## 2012-01-13 ENCOUNTER — Telehealth: Payer: Self-pay | Admitting: Family Medicine

## 2012-01-13 MED ORDER — AMOXICILLIN 500 MG PO TABS: 1000.0000 mg | ORAL_TABLET | Freq: Two times a day (BID) | ORAL | Status: AC

## 2012-01-13 NOTE — Telephone Encounter (Signed)
Saw Padonda last week. Was told to call if she was not better. Pt states she is actually worse. Any way to call something to [facility]? Thanks!

## 2012-01-13 NOTE — Telephone Encounter (Signed)
Pt aware.

## 2012-01-13 NOTE — Telephone Encounter (Signed)
Please call pt

## 2012-01-29 ENCOUNTER — Telehealth: Payer: Self-pay | Admitting: Internal Medicine

## 2012-01-29 MED ORDER — PREDNISONE 20 MG PO TABS: ORAL_TABLET | ORAL | Status: DC

## 2012-01-29 NOTE — Telephone Encounter (Signed)
Pt called and said that she has finished abx, but still has some symptoms. Pt is req refill of amoxicillin (AMOXIL) 500 MG tablet to [facility].

## 2012-01-29 NOTE — Telephone Encounter (Signed)
Please call in RX for Prednisone 20mg  3 tabs po qam x 3 days, 2 tabs po qam x 3 days, and 1 tab po qam x 3 days. #18, no refill. Does not need  More antibiotics.

## 2012-01-29 NOTE — Telephone Encounter (Signed)
Pt continues productive cough (green). No fever.  Got better for 2-3 days after finishing the antibiotic.  Sinus congestion is back.  Feeling sore in right side under breast from coughing.  Appetite ok.  Is not taking any cough meds or sinus meds.  Uses Flonase only. Asking for stronger antibiotic. No fever.

## 2012-01-29 NOTE — Telephone Encounter (Signed)
Pt aware Rx sent to pharmacy. Advised to call if no better after meds

## 2012-04-07 ENCOUNTER — Ambulatory Visit: Payer: BC Managed Care – PPO | Admitting: Family

## 2012-09-10 ENCOUNTER — Other Ambulatory Visit: Payer: Self-pay | Admitting: Obstetrics and Gynecology

## 2012-09-10 DIAGNOSIS — Z1231 Encounter for screening mammogram for malignant neoplasm of breast: Secondary | ICD-10-CM

## 2012-09-29 LAB — HM MAMMOGRAPHY

## 2012-09-29 LAB — HM PAP SMEAR

## 2012-10-09 ENCOUNTER — Ambulatory Visit (HOSPITAL_COMMUNITY)
Admission: RE | Admit: 2012-10-09 | Discharge: 2012-10-09 | Disposition: A | Discharge status: Home / Self Care | Payer: BC Managed Care – PPO | Source: Ambulatory Visit | Attending: Obstetrics and Gynecology | Admitting: Obstetrics and Gynecology

## 2012-10-09 DIAGNOSIS — Z1231 Encounter for screening mammogram for malignant neoplasm of breast: Secondary | ICD-10-CM | POA: Insufficient documentation

## 2012-10-13 ENCOUNTER — Encounter: Payer: Self-pay | Admitting: Obstetrics and Gynecology

## 2012-11-10 ENCOUNTER — Other Ambulatory Visit (INDEPENDENT_AMBULATORY_CARE_PROVIDER_SITE_OTHER): Payer: BC Managed Care – PPO

## 2012-11-10 DIAGNOSIS — Z0000 Encounter for general adult medical examination without abnormal findings: Secondary | ICD-10-CM

## 2012-11-10 LAB — CBC WITH DIFFERENTIAL/PLATELET
Basophils Absolute: 0 10*3/uL (ref 0.0–0.1)
Basophils Relative: 0.6 % (ref 0.0–3.0)
Eosinophils Absolute: 0.1 10*3/uL (ref 0.0–0.7)
Eosinophils Relative: 1.3 % (ref 0.0–5.0)
HCT: 39.5 % (ref 36.0–46.0)
Hemoglobin: 13.1 g/dL (ref 12.0–15.0)
Lymphocytes Relative: 33.7 % (ref 12.0–46.0)
Lymphs Abs: 1.5 10*3/uL (ref 0.7–4.0)
MCHC: 33.2 g/dL (ref 30.0–36.0)
MCV: 95.7 fl (ref 78.0–100.0)
Monocytes Absolute: 0.4 10*3/uL (ref 0.1–1.0)
Monocytes Relative: 8 % (ref 3.0–12.0)
Neutro Abs: 2.6 10*3/uL (ref 1.4–7.7)
Neutrophils Relative %: 56.4 % (ref 43.0–77.0)
Platelets: 154 10*3/uL (ref 150.0–400.0)
RBC: 4.13 Mil/uL (ref 3.87–5.11)
RDW: 13.4 % (ref 11.5–14.6)
WBC: 4.5 10*3/uL (ref 4.5–10.5)

## 2012-11-10 LAB — HEPATIC FUNCTION PANEL
ALT: 18 U/L (ref 0–35)
AST: 21 U/L (ref 0–37)
Albumin: 3.7 g/dL (ref 3.5–5.2)
Alkaline Phosphatase: 34 U/L — ABNORMAL LOW (ref 39–117)
Bilirubin, Direct: 0.1 mg/dL (ref 0.0–0.3)
Total Bilirubin: 0.8 mg/dL (ref 0.3–1.2)
Total Protein: 6.6 g/dL (ref 6.0–8.3)

## 2012-11-10 LAB — BASIC METABOLIC PANEL
BUN: 10 mg/dL (ref 6–23)
CO2: 28 mEq/L (ref 19–32)
Calcium: 8.6 mg/dL (ref 8.4–10.5)
Chloride: 106 mEq/L (ref 96–112)
Creatinine, Ser: 0.7 mg/dL (ref 0.4–1.2)
GFR: 101.81 mL/min (ref >60.00)
Glucose, Bld: 80 mg/dL (ref 70–99)
Potassium: 4.2 mEq/L (ref 3.5–5.1)
Sodium: 140 mEq/L (ref 135–145)

## 2012-11-10 LAB — POCT URINALYSIS DIPSTICK
Bilirubin, UA: NEGATIVE
Blood, UA: NEGATIVE
Glucose, UA: NEGATIVE
Ketones, UA: NEGATIVE
Leukocytes, UA: NEGATIVE
Nitrite, UA: NEGATIVE
Protein, UA: NEGATIVE
Spec Grav, UA: 1.02
Urobilinogen, UA: 0.2
pH, UA: 7

## 2012-11-10 LAB — TSH: TSH: 2.65 u[IU]/mL (ref 0.35–5.50)

## 2012-11-10 LAB — LIPID PANEL
Cholesterol: 128 mg/dL (ref 0–200)
HDL: 55.8 mg/dL (ref >39.00)
LDL Cholesterol: 60 mg/dL (ref 0–99)
Total CHOL/HDL Ratio: 2
Triglycerides: 61 mg/dL (ref 0.0–149.0)
VLDL: 12.2 mg/dL (ref 0.0–40.0)

## 2012-11-17 ENCOUNTER — Encounter: Payer: Self-pay | Admitting: Internal Medicine

## 2012-11-17 ENCOUNTER — Ambulatory Visit (INDEPENDENT_AMBULATORY_CARE_PROVIDER_SITE_OTHER): Payer: BC Managed Care – PPO | Admitting: Internal Medicine

## 2012-11-17 VITALS — BP 114/62 | HR 72 | Temp 98.6°F | Ht 67.25 in | Wt 128.0 lb

## 2012-11-17 DIAGNOSIS — Z0000 Encounter for general adult medical examination without abnormal findings: Secondary | ICD-10-CM

## 2012-11-17 MED ORDER — ACETAZOLAMIDE 125 MG PO TABS: 125.0000 mg | ORAL_TABLET | Freq: Two times a day (BID) | ORAL | Status: DC

## 2012-11-18 NOTE — Progress Notes (Signed)
Patient ID: ARITZEL GEERDES, female   DOB: Jan 06, 1971, 41 y.o.   MRN: [id number] CPX  Past Medical History  Diagnosis Date  . Depression   . Guillain-Barre syndrome     History   Social History  . Marital Status: Married    Spouse Name: N/A    Number of Children: N/A  . Years of Education: N/A   Occupational History  . Not on file.   Social History Main Topics  . Smoking status: Never Smoker   . Smokeless tobacco: Not on file  . Alcohol Use:   . Drug Use:   . Sexually Active:    Other Topics Concern  . Not on file   Social History Narrative  . No narrative on file    Past Surgical History  Procedure Date  . Cesarean section   . Schwanoma     Family History  Problem Relation Age of Onset  . Melanoma Father     No Known Allergies  Current Outpatient Prescriptions on File Prior to Visit  Medication Sig Dispense Refill  . mirtazapine (REMERON) 15 MG tablet Take 15 mg by mouth at bedtime.        Marland Kitchen acetaZOLAMIDE (DIAMOX) 125 MG tablet Take 1 tablet (125 mg total) by mouth 2 (two) times daily. Start one day prior to ascension and take for 3 days  20 tablet  0     patient denies chest pain, shortness of breath, orthopnea. Denies lower extremity edema, abdominal pain, change in appetite, change in bowel movements. Patient denies rashes, musculoskeletal complaints. No other specific complaints in a complete review of systems.   BP 114/62  Pulse 72  Temp 98.6 F (37 C) (Oral)  Ht 5' 7.25" (1.708 m)  Wt 128 lb (58.06 kg)  BMI 19.90 kg/m2  Well-developed well-nourished female in no acute distress. HEENT exam atraumatic, normocephalic, extraocular muscles are intact. Neck is supple. No jugular venous distention no thyromegaly. Chest clear to auscultation without increased work of breathing. Cardiac exam S1 and S2 are regular. Abdominal exam active bowel sounds, soft, nontender. Extremities no edema. Neurologic exam she is alert without any motor sensory deficits. Gait is  normal.  A/p- well visit , health maint UTD

## 2013-04-22 ENCOUNTER — Telehealth: Payer: Self-pay | Admitting: Internal Medicine

## 2013-04-22 NOTE — Telephone Encounter (Signed)
Patient Information: ° Caller Name: Marrisa ° Phone: [phone] ° Patient: Makayla Taylor, Makayla Taylor ° Gender: Female ° DOB: 05/17/1971 ° Age: 42 Years ° PCP: Swords, Bruce (Adults only) ° Pregnant: No ° °Office Follow Up: ° Does the office need to follow up with this patient?: Yes ° Instructions For The Office: Wants Rx called in for cipro krs/can ° °RN Note: ° Patient currently having multiple episodes of diarrhea.  States no blood noted in stool and no mucus/pus.  Would like Rx for cipro called in.  Per diarrhea protocol, emergent symptoms denied; info to office for provider review/Rx/callback.  Uses [facility].  May reach patient at [phone].  krs/can ° °Symptoms ° Reason For Call & Symptoms: Took her usual cipro to a trip to [place], but other members of travelling got ill and they got the cipro.  States on arriving home, she developed diarrhea. ° Reviewed Health History In EMR: Yes ° Reviewed Medications In EMR: Yes ° Reviewed Allergies In EMR: Yes ° Reviewed Surgeries / Procedures: Yes ° Date of Onset of Symptoms: 04/17/2013 °OB / GYN: ° LMP: 04/10/2013 ° °Guideline(s) Used: ° Diarrhea ° °Disposition Per Guideline:  ° Callback by PCP Today ° °Reason For Disposition Reached:  ° Travel to a foreign country in past month ° °Advice Given: ° N/A ° °Patient Will Follow Care Advice: ° YES ° ° °

## 2013-04-22 NOTE — Telephone Encounter (Signed)
Duplicate

## 2013-04-22 NOTE — Telephone Encounter (Signed)
Patient Information:  Caller Name: Lariza  Phone: [phone]  Patient: Makayla Taylor  Gender: Female  DOB: March 18, 1971  Age: 42 Years  PCP: Birdie Sons (Adults only)  Pregnant: No  Office Follow Up:  Does the office need to follow up with this patient?: Yes  Instructions For The Office: Wants Rx called in for cipro krs/can  RN Note:  Patient currently having multiple episodes of diarrhea.  States no blood noted in stool and no mucus/pus.  Would like Rx for cipro called in.  Per diarrhea protocol, emergent symptoms denied; info to office for provider review/Rx/callback.  Uses [facility].  May reach patient at [phone].  krs/can  Symptoms  Reason For Call & Symptoms: Took her usual cipro to a trip to [place], but other members of travelling got ill and they got the cipro.  States on arriving home, she developed diarrhea.  Reviewed Health History In EMR: Yes  Reviewed Medications In EMR: Yes  Reviewed Allergies In EMR: Yes  Reviewed Surgeries / Procedures: Yes  Date of Onset of Symptoms: 04/17/2013 OB / GYN:  LMP: 04/10/2013  Guideline(s) Used:  Diarrhea  Disposition Per Guideline:   Callback by PCP Today  Reason For Disposition Reached:   Travel to a foreign country in past month  Advice Given:  N/A  Patient Will Follow Care Advice:  YES

## 2013-04-23 ENCOUNTER — Telehealth: Payer: Self-pay | Admitting: Internal Medicine

## 2013-04-23 NOTE — Telephone Encounter (Signed)
Per dr Lovell Sheehan needs ov with available md- Left message on machine

## 2013-04-23 NOTE — Telephone Encounter (Signed)
Caller: Makayla Taylor/Patient; Phone: [phone]; Reason for Call: Patient calling in follow up regarding cipro Rx.  States she contacted office x 2 04/22/13 and did not get a call back from staff regarding Rx.  TC to office; advised staff is working on request and will contact patient this afternoon.  May reach patient at [phone].  Krs/can

## 2013-04-23 NOTE — Telephone Encounter (Signed)
Caller: Makayla Taylor/Patient; Phone: [phone]; Reason for Call: Patient calling in follow up to request for cipro, as the Rx she filled for trip to [place] was used by other members of her team while there.  Onset of diarrhea when she returned to [place].  Declines new triage.  Per epic, Dr.  Lovell Sheehan advised appt; unable to come to appt today.  Appt scheduled 04/24/13 1130 at [place] office.  Krs/can

## 2013-04-23 NOTE — Telephone Encounter (Signed)
Per dr Lovell Sheehan. Needs to see available md- Left message on machine for pt

## 2013-04-24 ENCOUNTER — Ambulatory Visit (INDEPENDENT_AMBULATORY_CARE_PROVIDER_SITE_OTHER): Payer: BC Managed Care – PPO | Admitting: Internal Medicine

## 2013-04-24 ENCOUNTER — Encounter: Payer: Self-pay | Admitting: Internal Medicine

## 2013-04-24 VITALS — BP 110/70 | HR 72 | Temp 98.9°F | Wt 136.0 lb

## 2013-04-24 DIAGNOSIS — A09 Infectious gastroenteritis and colitis, unspecified: Secondary | ICD-10-CM

## 2013-04-24 MED ORDER — CIPROFLOXACIN HCL 500 MG PO TABS: 500.0000 mg | ORAL_TABLET | Freq: Two times a day (BID) | ORAL | Status: DC

## 2013-04-24 NOTE — Patient Instructions (Signed)
cipro for 3 days  Contact PCP for advice for further travel  If  persistent or progressive may need more evaluation.   Travelers' Diarrhea Travelers' diarrhea (TD) is the most common illness affecting travelers. Each year many travelers develop diarrhea. TD usually occurs within the first week of travel. However, it may occur at any time while traveling. It may even occur after returning home. The most important risk factor is where you are going. High-risk places are the developing countries of:  [place].  [place].  [place].  [place]. High risk people include young adults and those with:  Transplants.  HIV infections.  Medicine that suppresses the immune system.  Inflammatory-bowel disease.  Diabetes.  H-2 blockers or antacids. Attack rates are similar for men and women. The primary source of TD is eating or drinking food or water tainted with feces (stool or bowel movements). CAUSES  Infectious agents are the primary cause of TD. Germs cause almost 80% of TD cases. The most common germ produces:  Watery diarrhea with cramps.  Low-grade or no fever. There are many other bacterial, viral and parasitic pathogens (disease causing "bugs").  SYMPTOMS  Most TD cases begin suddenly. Symptoms include stool that is increased in:  Frequency.  Volume.  Weight. Altered stool consistency also is common. Typically, you have four to five loose or watery bowel movements each day. Other common symptoms are:  Nausea.  Vomiting.  Diarrhea.  Abdominal cramping.  Bloating.  Fever.  Urgency.  Malaise. Most cases are not dangerous. Most cases go away in 1-2 days without treatment. TD is rarely life threatening. 90% of cases resolve within 1 week. 98% resolve within 1 month. PREVENTION   Avoid foods or beverages purchased from street vendors in high risk countries.  Avoid food from places where unclean conditions are present.  Avoid raw or undercooked meat and  seafood.  Avoid raw fruits (e.g., oranges, bananas, avocados) and vegetables unless you peel them yourself.  If handled properly, well-cooked and packaged foods usually are safe. Foods associated with increased risk for TD include:  Tap water.  Ice.  Unpasteurized milk.  Dairy products.  Safe beverages include:  Bottled carbonated beverages.  Hot tea or coffee.  Beer.  Wine.  Boiled water.  Water treated with iodine or chlorine. ANTIBIOTICS ARE NOT RECOMMENDED AS PREVENTION  CDC (Centers for Disease Control) does not recommend antimicrobial drugs (medicine that kill germs) to prevent TD. Several studies show that Pepto-Bismol taken as either 2 tablets 4 times daily, or 2 fluid ounces 4 times daily, reduces the incidence of travelers' diarrhea. People that should avoid Pepto-Bismol include those who are:  Pregnant.  Allergic to aspirin.  Taking anticoagulants medicine (probenecid, methotrexate).  Be informed about potential side effects, in particular about temporary blackening of the tongue and stool, and rarely ringing in the ears. Because of potential adverse side effects, preventative Pepto-Bismol should not be used for more than 3 weeks.  Some antibiotics taken in a once-a-day dose are 90% effective at preventing travelers' diarrhea. However, antibiotics are not recommended as prevention. Routine antimicrobial prophylaxis increases your risk for:  Adverse reactions.  Infections with resistant organisms.  Antibiotics can increase your susceptibility to resistant bacterial pathogens and provide no protection against either viral or parasitic pathogens. This can give travelers a false sense of security. As a result, strict adherence to preventive measures is encouraged. Pepto-Bismol should be used as an extra effort if prophylaxis is needed. TREATMENT   TD usually is  a self-limited disorder. It gets well without treatment. It often goes away without specific  treatment. Oral re-hydration is often helpful to replace lost fluids and electrolytes. Clear liquids are routinely recommended for adults. You may be helped with antimicrobial therapy if you develop three or more loose stools in an 8-hour period, especially if associated with:  Nausea.  Vomiting.  Abdominal cramps.  Fever.  Blood in stools.  Antibiotics usually are given for 3-5 days. Pepto-Bismol also may be used as treatment. Take one fluid ounce, or two 262 mg tablets every 30 minutes, for up to 8 doses in a 24-hour period. This can be repeated on a second day. If diarrhea persists despite therapy, you should be evaluated by a caregiver and treated for possible parasitic infection.  Because drug resistance is a continuing problem and may vary from country to country, professional assistance should be looked for if problems persist.  Antimotility agents (loperamide, diphenoxylate, and paregoric) mostly reduce diarrhea by slowing down the passage of food and drink in the gut. This allows more time for absorption. Some persons believe diarrhea is the body's defense mechanism to minimize contact time between gut pathogens and lining of the bowel. In several studies, antimotility agents have been useful in treating travelers' diarrhea by decreasing the duration of diarrhea. However, these agents should never be used by persons with fever or bloody diarrhea because they can increase the severity of disease by delaying clearance of causative organisms. Because antimotility agents are now available over the counter, their improper use is of concern. Complications have been reported from the use of these medicines such as:  Toxic megacolon.  Sepsis.  Disseminated intravascular coagulation. SEEK IMMEDIATE MEDICAL CARE IF:   You are unable to keep fluids down.  Vomiting or diarrhea becomes persistent.  Abdominal (belly) pain develops or increases or localizes. (Right sided pain can be  appendicitis and left sided pain in adults can be diverticulitis).  You develop an oral temperature above 102 F (38.9 C), or as your caregiver suggests.  Diarrhea becomes excessive or contains blood or mucous.  Excessive weakness, dizziness, fainting or extreme thirst.  Checking weight 2 to 3 times per day in babies and children will help verify adequate fluid replacement. Your caregiver will tell you what loss should concern you or suggest another visit to your personal physician.  Record your weight or your child's weight today. Compare this to your home scale and record all weights and time and date weighed. Try to check weight at the same times every day. Bring this chart to your caregivers if you or your child needs to be seen again. FOR MORE INFORMATION  Travelers should consult with a caregiver before departing on a trip abroad. Information about TD is available from:  Your local or state health departments.  World Science writer [facility]). Other information that may be of interest to travelers can be found at the [facility] Travelers' Health homepage at [employer]gl. Document Released: 12/06/2002 Document Revised: 03/09/2012 Document Reviewed: 02/23/2009 [facility] Patient Information 2013 [place], [place].

## 2013-04-24 NOTE — Progress Notes (Signed)
Chief Complaint  Patient presents with  . Diarrhea    HPI: Patient comes in today for SDA for  new problem evaluation. Saturday clinic referral in.   Problems with diarrhea for 7- 8 days onset during  travel time coming back fr [place] . Other s also got same  But had rx and better    Had left over cipro and given to others so she didn't take any meds yet and still having sx     Some improvement in past 2 days now 5 x per day frequency  no blood .   No fever. abd cramps after eating.  Not as sick as in past when had t d   No meds for this had waited for this to go away and persisted ROS: See pertinent positives and negatives per HPI. No cp sob chills fever  No cough rash   [place] and next trip June ?   Past Medical History  Diagnosis Date  . Depression   . Guillain-Barre syndrome     Family History  Problem Relation Age of Onset  . Melanoma Father     History   Social History  . Marital Status: Married    Spouse Name: N/A    Number of Children: N/A  . Years of Education: N/A   Social History Main Topics  . Smoking status: Never Smoker   . Smokeless tobacco: None  . Alcohol Use:   . Drug Use:   . Sexually Active:    Other Topics Concern  . None   Social History Narrative  . None    Outpatient Encounter Prescriptions as of 04/24/2013  Medication Sig Dispense Refill  . mirtazapine (REMERON) 15 MG tablet Take 15 mg by mouth at bedtime.        Marland Kitchen acetaZOLAMIDE (DIAMOX) 125 MG tablet Take 1 tablet (125 mg total) by mouth 2 (two) times daily. Start one day prior to ascension and take for 3 days  20 tablet  0  . ciprofloxacin (CIPRO) 500 MG tablet Take 1 tablet (500 mg total) by mouth 2 (two) times daily.  6 tablet  0   No facility-administered encounter medications on file as of 04/24/2013.    EXAM:  BP 110/70  Pulse 72  Temp(Src) 98.9 F (37.2 C) (Oral)  Wt 136 lb (61.689 kg)  BMI 21.15 kg/m2  SpO2 97%  Body mass index is 21.15  kg/(m^2).  GENERAL: vitals reviewed and listed above, alert, oriented, appears well hydrated and in no acute distress  HEENT: Normocephalic ;atraumatic , Eyes;  PERRL, EOMs  Full, lids and conjunctiva clear,,Ears: no deformities, canals nl, TM landmarks normal, Nose: no deformity or discharge  Mouth : OP clear without lesion or edema . 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  Contact PCP about  Future rx  Because she travels  so much.    Disc patient portal and activation code given.  -Patient advised to return or notify health care team  if symptoms worsen or persist or new concerns arise.  Patient Instructions  cipro for 3 days  Contact PCP for advice for further travel  If  persistent or progressive may need more evaluation.   Travelers' Diarrhea Travelers' diarrhea (TD) is the most common illness affecting travelers. Each year many travelers develop diarrhea. TD usually occurs within the first week of travel. However, it may occur at any time while traveling. It may even occur after returning home. The most important risk factor is where you are going. High-risk places are the developing countries of:  [place].  [place].  [place].  [place]. High risk people include young adults and those with:  Transplants.  HIV infections.  Medicine  that suppresses the immune system.  Inflammatory-bowel disease.  Diabetes.  H-2 blockers or antacids. Attack rates are similar for men and women. The primary source of TD is eating or drinking food or water tainted with feces (stool or bowel movements). CAUSES  Infectious agents are the primary cause of TD. Germs cause almost 80% of TD cases. The most common germ produces:  Watery diarrhea with cramps.  Low-grade or no fever. There are many other bacterial, viral and parasitic pathogens (disease causing "bugs").  SYMPTOMS  Most TD cases begin suddenly. Symptoms include stool that is increased in:  Frequency.  Volume.  Weight. Altered stool consistency also is common. Typically, you have four to five loose or watery bowel movements each day. Other common symptoms are:  Nausea.  Vomiting.  Diarrhea.  Abdominal cramping.  Bloating.  Fever.  Urgency.  Malaise. Most cases are not dangerous. Most cases go away in 1-2 days without treatment. TD is rarely life threatening. 90% of cases resolve within 1 week. 98% resolve within 1 month. PREVENTION   Avoid foods or beverages purchased from street vendors in high risk countries.  Avoid food from places where unclean conditions are present.  Avoid raw or undercooked meat and seafood.  Avoid raw fruits (e.g., oranges, bananas, avocados) and vegetables unless you peel them yourself.  If handled properly, well-cooked and packaged foods usually are safe. Foods associated with increased risk for TD include:  Tap water.  Ice.  Unpasteurized milk.  Dairy products.  Safe beverages include:  Bottled carbonated beverages.  Hot tea or coffee.  Beer.  Wine.  Boiled water.  Water treated with iodine or chlorine. ANTIBIOTICS ARE NOT RECOMMENDED AS PREVENTION  CDC (Centers for Disease Control) does not recommend antimicrobial drugs (medicine that kill germs) to prevent TD. Several studies show that Pepto-Bismol taken  as either 2 tablets 4 times daily, or 2 fluid ounces 4 times daily, reduces the incidence of travelers' diarrhea. People that should avoid Pepto-Bismol include those who are:  Pregnant.  Allergic to aspirin.  Taking anticoagulants medicine (probenecid, methotrexate).  Be informed about potential side effects, in particular about temporary blackening of the tongue and stool, and rarely ringing in the ears. Because of potential adverse side effects, preventative Pepto-Bismol should not be used for more than 3 weeks.  Some antibiotics taken in a once-a-day dose are 90% effective at preventing travelers' diarrhea. However, antibiotics are not recommended as prevention. Routine antimicrobial prophylaxis increases your risk for:  Adverse reactions.  Infections with resistant organisms.  Antibiotics can increase your susceptibility to resistant bacterial pathogens and provide no protection against either viral or parasitic pathogens.  This can give travelers a false sense of security. As a result, strict adherence to preventive measures is encouraged. Pepto-Bismol should be used as an extra effort if prophylaxis is needed. TREATMENT   TD usually is a self-limited disorder. It gets well without treatment. It often goes away without specific treatment. Oral re-hydration is often helpful to replace lost fluids and electrolytes. Clear liquids are routinely recommended for adults. You may be helped with antimicrobial therapy if you develop three or more loose stools in an 8-hour period, especially if associated with:  Nausea.  Vomiting.  Abdominal cramps.  Fever.  Blood in stools.  Antibiotics usually are given for 3-5 days. Pepto-Bismol also may be used as treatment. Take one fluid ounce, or two 262 mg tablets every 30 minutes, for up to 8 doses in a 24-hour period. This can be repeated on a second day. If diarrhea persists despite therapy, you should be evaluated by a caregiver and treated  for possible parasitic infection.  Because drug resistance is a continuing problem and may vary from country to country, professional assistance should be looked for if problems persist.  Antimotility agents (loperamide, diphenoxylate, and paregoric) mostly reduce diarrhea by slowing down the passage of food and drink in the gut. This allows more time for absorption. Some persons believe diarrhea is the body's defense mechanism to minimize contact time between gut pathogens and lining of the bowel. In several studies, antimotility agents have been useful in treating travelers' diarrhea by decreasing the duration of diarrhea. However, these agents should never be used by persons with fever or bloody diarrhea because they can increase the severity of disease by delaying clearance of causative organisms. Because antimotility agents are now available over the counter, their improper use is of concern. Complications have been reported from the use of these medicines such as:  Toxic megacolon.  Sepsis.  Disseminated intravascular coagulation. SEEK IMMEDIATE MEDICAL CARE IF:   You are unable to keep fluids down.  Vomiting or diarrhea becomes persistent.  Abdominal (belly) pain develops or increases or localizes. (Right sided pain can be appendicitis and left sided pain in adults can be diverticulitis).  You develop an oral temperature above 102 F (38.9 C), or as your caregiver suggests.  Diarrhea becomes excessive or contains blood or mucous.  Excessive weakness, dizziness, fainting or extreme thirst.  Checking weight 2 to 3 times per day in babies and children will help verify adequate fluid replacement. Your caregiver will tell you what loss should concern you or suggest another visit to your personal physician.  Record your weight or your child's weight today. Compare this to your home scale and record all weights and time and date weighed. Try to check weight at the same times every day.  Bring this chart to your caregivers if you or your child needs to be seen again. FOR MORE INFORMATION  Travelers should consult with a caregiver before departing on a trip abroad. Information about TD is available from:  Your local or state health departments.  World Science writer [facility]). Other information that may be of interest to travelers can be found at the [facility] Travelers' Health homepage at QuestDrive.gl. Document Released: 12/06/2002 Document Revised: 03/09/2012 Document Reviewed: 02/23/2009 [facility] Patient Information 2013 [place], [place].      Neta Mends. Panosh M.D.

## 2013-04-26 ENCOUNTER — Encounter: Payer: Self-pay | Admitting: Internal Medicine

## 2013-11-03 ENCOUNTER — Other Ambulatory Visit: Payer: Self-pay | Admitting: Obstetrics and Gynecology

## 2013-11-03 DIAGNOSIS — Z1231 Encounter for screening mammogram for malignant neoplasm of breast: Secondary | ICD-10-CM

## 2013-11-18 ENCOUNTER — Ambulatory Visit (HOSPITAL_COMMUNITY): Payer: BC Managed Care – PPO

## 2013-12-07 ENCOUNTER — Ambulatory Visit (HOSPITAL_COMMUNITY)
Admission: RE | Admit: 2013-12-07 | Discharge: 2013-12-07 | Disposition: A | Discharge status: Home / Self Care | Payer: BC Managed Care – PPO | Source: Ambulatory Visit | Attending: Obstetrics and Gynecology | Admitting: Obstetrics and Gynecology

## 2013-12-07 DIAGNOSIS — Z1231 Encounter for screening mammogram for malignant neoplasm of breast: Secondary | ICD-10-CM | POA: Insufficient documentation

## 2014-02-01 ENCOUNTER — Encounter: Payer: Self-pay | Admitting: Family Medicine

## 2014-02-01 ENCOUNTER — Ambulatory Visit: Payer: BC Managed Care – PPO | Admitting: Family Medicine

## 2014-02-01 ENCOUNTER — Ambulatory Visit (INDEPENDENT_AMBULATORY_CARE_PROVIDER_SITE_OTHER): Payer: BC Managed Care – PPO | Admitting: Family Medicine

## 2014-02-01 VITALS — BP 126/80 | Temp 98.8°F | Wt 146.0 lb

## 2014-02-01 DIAGNOSIS — L259 Unspecified contact dermatitis, unspecified cause: Secondary | ICD-10-CM

## 2014-02-01 DIAGNOSIS — T7840XA Allergy, unspecified, initial encounter: Secondary | ICD-10-CM

## 2014-02-01 MED ORDER — TRIAMCINOLONE ACETONIDE 0.1 % EX CREA: 1.0000 "application " | TOPICAL_CREAM | Freq: Two times a day (BID) | CUTANEOUS | Status: DC

## 2014-02-01 NOTE — Patient Instructions (Signed)
-  hypoallergenic skin products (dove soap, cerave cream or cetaphil restoraderm for moisterizer, hypoallergenic detergent)  -use steroid cream twice daily for one week  -zyrtec daily  -follow up as needed

## 2014-02-01 NOTE — Progress Notes (Signed)
Chief Complaint  Patient presents with  . Allergic Reaction    HPI:  Makayla Taylor is a patient of Dr. Leanne Chang here for an acute visit for:  Allergic Reaction to tee tree oil: -had been putting on neck and on SK on side and has developed itchy rash in these areas, stopped using several days ago -now trouble breathing, spreading of rash, swellig of mouth or lips, fevers, malaise  ROS: See pertinent positives and negatives per HPI.  Past Medical History  Diagnosis Date  . Depression   . Guillain-Barre syndrome     Past Surgical History  Procedure Laterality Date  . Cesarean section    . Schwanoma      Family History  Problem Relation Age of Onset  . Melanoma Father     History   Social History  . Marital Status: Married    Spouse Name: N/A    Number of Children: N/A  . Years of Education: N/A   Social History Main Topics  . Smoking status: Never Smoker   . Smokeless tobacco: None  . Alcohol Use:   . Drug Use:   . Sexual Activity:    Other Topics Concern  . None   Social History Narrative  . None    Current outpatient prescriptions:mirtazapine (REMERON) 15 MG tablet, Take 15 mg by mouth at bedtime., Disp: , Rfl: ;  triamcinolone cream (KENALOG) 0.1 %, Apply 1 application topically 2 (two) times daily., Disp: 30 g, Rfl: 0  EXAM:  Filed Vitals:   02/01/14 0940  BP: 126/80  Temp: 98.8 F (37.1 C)    Body mass index is 22.7 kg/(m^2).  GENERAL: vitals reviewed and listed above, alert, oriented, appears well hydrated and in no acute distress  HEENT: atraumatic, conjunttiva clear, no obvious abnormalities on inspection of external nose and ears  NECK: no obvious masses on inspection  SKIN: erythematous maculopapular rash on neck and small patch on L truck at site of SK  MS: moves all extremities without noticeable abnormality  PSYCH: pleasant and cooperative, no obvious depression or anxiety  ASSESSMENT AND PLAN:  Discussed the following  assessment and plan:  Allergic reaction - Plan: triamcinolone cream (KENALOG) 0.1 %  Contact dermatitis - Plan: triamcinolone cream (KENALOG) 0.1 %  -Patient advised to return or notify a doctor immediately if symptoms worsen or persist or new concerns arise.  Patient Instructions  -hypoallergenic skin products (dove soap, cerave cream or cetaphil restoraderm for moisterizer, hypoallergenic detergent)  -use steroid cream twice daily for one week  -zyrtec daily  -follow up as needed     KIM, Jarrett Soho R.

## 2014-02-01 NOTE — Progress Notes (Signed)
Pre visit review using our clinic review tool, if applicable. No additional management support is needed unless otherwise documented below in the visit note. 

## 2014-11-15 ENCOUNTER — Other Ambulatory Visit: Payer: Self-pay | Admitting: Obstetrics and Gynecology

## 2014-12-14 ENCOUNTER — Other Ambulatory Visit: Payer: Self-pay | Admitting: Obstetrics and Gynecology

## 2015-01-02 ENCOUNTER — Other Ambulatory Visit: Payer: Self-pay | Admitting: Obstetrics and Gynecology

## 2015-02-28 HISTORY — PX: OTHER SURGICAL HISTORY: SHX169

## 2015-03-08 ENCOUNTER — Other Ambulatory Visit: Payer: Self-pay | Admitting: Obstetrics and Gynecology

## 2015-03-10 ENCOUNTER — Other Ambulatory Visit (HOSPITAL_COMMUNITY): Payer: Self-pay | Admitting: Obstetrics and Gynecology

## 2015-03-10 ENCOUNTER — Encounter (HOSPITAL_COMMUNITY)
Admission: RE | Admit: 2015-03-10 | Discharge: 2015-03-10 | Disposition: A | Discharge status: Home / Self Care | Payer: BLUE CROSS/BLUE SHIELD | Source: Ambulatory Visit | Attending: Obstetrics and Gynecology | Admitting: Obstetrics and Gynecology

## 2015-03-10 ENCOUNTER — Encounter (HOSPITAL_COMMUNITY): Payer: Self-pay

## 2015-03-10 DIAGNOSIS — Z01812 Encounter for preprocedural laboratory examination: Secondary | ICD-10-CM | POA: Insufficient documentation

## 2015-03-10 DIAGNOSIS — D259 Leiomyoma of uterus, unspecified: Secondary | ICD-10-CM | POA: Insufficient documentation

## 2015-03-10 HISTORY — DX: Adverse effect of unspecified anesthetic, initial encounter: T41.45XA

## 2015-03-10 HISTORY — DX: Other complications of anesthesia, initial encounter: T88.59XA

## 2015-03-10 LAB — BASIC METABOLIC PANEL
Anion gap: 6 (ref 5–15)
BUN: 7 mg/dL (ref 6–23)
CO2: 27 mmol/L (ref 19–32)
Calcium: 8.5 mg/dL (ref 8.4–10.5)
Chloride: 105 mmol/L (ref 96–112)
Creatinine, Ser: 0.72 mg/dL (ref 0.50–1.10)
GFR calc Af Amer: >90 mL/min (ref >90)
GFR calc non Af Amer: >90 mL/min (ref >90)
Glucose, Bld: 102 mg/dL — ABNORMAL HIGH (ref 70–99)
Potassium: 4.1 mmol/L (ref 3.5–5.1)
Sodium: 138 mmol/L (ref 135–145)

## 2015-03-10 LAB — CBC
HCT: 33.7 % — ABNORMAL LOW (ref 36.0–46.0)
Hemoglobin: 11.2 g/dL — ABNORMAL LOW (ref 12.0–15.0)
MCH: 28.8 pg (ref 26.0–34.0)
MCHC: 33.2 g/dL (ref 30.0–36.0)
MCV: 86.6 fL (ref 78.0–100.0)
Platelets: 122 10*3/uL — ABNORMAL LOW (ref 150–400)
RBC: 3.89 MIL/uL (ref 3.87–5.11)
RDW: 13.9 % (ref 11.5–15.5)
WBC: 3.3 10*3/uL — ABNORMAL LOW (ref 4.0–10.5)

## 2015-03-10 NOTE — Anesthesia Preprocedure Evaluation (Addendum)
Anesthesia Evaluation  Patient identified by MRN, date of birth, ID band Patient awake    Reviewed: Allergy & Precautions, H&P , Patient's Chart, lab work & pertinent test results, reviewed documented beta blocker date and time   History of Anesthesia Complications Negative for: history of anesthetic complications  Airway Mallampati: IV  TM Distance: <3 FB Neck ROM: limited  Mouth opening: Limited Mouth Opening  Dental   Pulmonary  breath sounds clear to auscultation        Cardiovascular Exercise Tolerance: Good Rhythm:regular Rate:Normal     Neuro/Psych    GI/Hepatic   Endo/Other    Renal/GU      Musculoskeletal   Abdominal   Peds  Hematology   Anesthesia Other Findings Guillain-Barre- resolved in 2000   Reproductive/Obstetrics                             Anesthesia Physical Anesthesia Plan  ASA: II  Anesthesia Plan: General ETT   Post-op Pain Management:    Induction: Intravenous  Airway Management Planned: Video Laryngoscope Planned  Additional Equipment:   Intra-op Plan:   Post-operative Plan:   Informed Consent: I have reviewed the patients History and Physical, chart, labs and discussed the procedure including the risks, benefits and alternatives for the proposed anesthesia with the patient or authorized representative who has indicated his/her understanding and acceptance.   Dental Advisory Given  Plan Discussed with: CRNA and Surgeon  Anesthesia Plan Comments:       Patient does not have heartburn or reflux.  Patient had awake fiberoptic intubation in distant past, but, depending on the provider taking care of her, I feel that an asleep fiberoptic or a glidescope would be a safe alternative.  I spoke to her about the importance of NPO and that we would finalize the plans on the day of surgery as to how to manage her difficult airway. Anesthesia Quick  Evaluation

## 2015-03-10 NOTE — H&P (Signed)
Makayla Taylor is a 44 y.o. female  female P 2-0-0-2 presents for a robot assisted myomectomy with morcellation because of menorrhagia.  The patient with know uterine fibroid, noticed that over the past year, her menstrual flow began to last 12-14 days.  During this time she would pass significant sized clots and change her heavy flow pad every 2-3 hours.  She reports some lower back discomfort with her bleeding, rated as 3/10 on a 10 point pain scale but no significant pelvic cramping.   She denies any post coital bleeding, dyspareunia, changes in bowel function or bladder complaints.  An endometrial biopys in November 2015 returned benign findings.  A pelvic ultrasound/sono-hysterogram at the same time revealed: uterus: 8.11 x 6.82 x 7.54 cm, endometrium-0.688 cm and a left anterior intramural fibroid-5.6 x 5.4 x 6.4 cm that with saline infusion was shown not to have a submucosal component.  Right ovary-2.84 x 1.99 x 2.12 cm and left ovary-2.76 x 1.93 x 1.44 cm.  In addition the patient's TSH and CBC were normal.  Over the course of the year the patient did not pursue medical management of her symptoms.  After a review of both the medical and surgical mangaement options the patient has decided to proceed with myomectomy.  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  ROS: Denies corrective lenses,  headache, vision changes, nasal congestion, dysphagia, tinnitus, dizziness, hoarseness, cough,  chest pain, shortness of breath, nausea, vomiting, diarrhea,constipation,  urinary frequency, urgency  dysuria, hematuria, vaginitis symptoms, pelvic pain, swelling of joints,easy bruising,  myalgias, arthralgias, skin rashes, unexplained weight loss and except as is mentioned in the history of present illness, patient's review of systems is otherwise negative.   Physical Exam  Bp: 92/58       P: 68     R:  17       Temperature: 98.6 degrees F orally     Weight: 137 lbs.      Height: 5\' 6"      BMI: 22.1  Neck: supple without masses or thyromegaly Lungs: clear to auscultation Heart: regular rate and rhythm Abdomen: soft, non-tender with firm mass from pelvis to 3 fingers above symphysis pubis;  no organomegaly Pelvic:EGBUS- wnl; vagina-normal rugae; uterus-14 weeks size, cervix without lesions or motion tenderness; adnexae-no tenderness or masses Extremities:  no clubbing, cyanosis or edema   Assesment: Symptomatic Fibroid            Menorrhagia   Disposition:  Robotically-assisted myomectomy was reviewed with  the patient.   Benefits of the robotic approach include lesser post-operative pain, less blood loss during surgery, reduced risk of injury to other organs due to better visualization with a 3-D HD 10 times magnifying camera, shorter hospital stay between 0-1 night and rapid recovery with return to daily routine in 2-3 weeks. Although robotically-assisted myomectomy has a longer operative time than traditional  laparotomy, in a patient with good medical history, the benefits usually outweigh the risks.   Risks include but are not limited to bleeding, infection, injury to other organs, need for laparotomy and transient post-operative facial edema.   Patient informed about FDA warning on use of morcellator dated 04/15/2013.  Discussed:  1. Incidence of post-operative diagnosis of sarcoma in women undergoing a hysterectomy is 2:1000 2. Annual incidence of leiomyosarcoma is 0.64/100,000 women 3. Sarcomas have the highest incidence in women over 65 4. Power morcellation involves risks of spreading tissue / disease. In he case of undiagnosed cancer, it may adversely affect the patient's prognosis.  Also discussed:  1.Lack of tactile feedback with the robotic approach leaving non-visible fibroids impossible to resect with potential future growth.  2. Need for cesarean section if there is uterine cavity entry and/or multiple incisions  3. Risk of developing NEW fibroids estimated at 25 %  Patient voices understanding and desires to proceed as planned with a Robot Assist Laparoscopic Myomectomy with Morcellation at [place] on March 21, 2015 at  1 p.m.      CSN# [id number]   Harlynn Kimbell J. Florene Glen, PA-C  for Dr. Dede Query. [place]

## 2015-03-10 NOTE — Patient Instructions (Signed)
   Your procedure is scheduled on: MARCH 22 AT 1PM  Enter through the Main Entrance of [facility] at: [place] up the phone at the desk and dial [phone] and inform us of your arrival.  Please call this number if you have any problems the morning of surgery: [phone]  Remember: Do not eat food after midnight: MARCH 21 DO NOT HAVE CLEAR LIQUIDS AFTER 9AM DAY OF SURGERY  Take these medicines the morning of surgery with a SIP OF WATER: NONE  Do not wear jewelry, make-up, or FINGER nail polish No metal in your hair or on your body. Do not wear lotions, powders, perfumes.  You may wear deodorant.  Do not bring valuables to the hospital. Contacts, dentures or bridgework may not be worn into surgery.  Leave suitcase in the car. After Surgery it may be brought to your room. For patients being admitted to the hospital, checkout time is 11:00am the day of discharge.    Patients discharged on the day of surgery will not be allowed to drive home.

## 2015-03-10 NOTE — Progress Notes (Signed)
LEFT VM WITH DR. Cletis Media OFFICE REGARDING PT'S WBC 3.3 AND PLAT 122.Marland Kitchen

## 2015-03-16 ENCOUNTER — Telehealth: Payer: Self-pay | Admitting: Hematology and Oncology

## 2015-03-16 NOTE — Telephone Encounter (Signed)
Called pt left vm in ref to np appt. 04/20/15@1 :30

## 2015-03-20 MED ORDER — DEXTROSE 5 % IV SOLN
2.0000 g | INTRAVENOUS | Status: AC
  Administered 2015-03-21: 2 g via INTRAVENOUS
  Filled 2015-03-20: qty 2

## 2015-03-21 ENCOUNTER — Ambulatory Visit (HOSPITAL_COMMUNITY): Payer: BLUE CROSS/BLUE SHIELD | Admitting: Anesthesiology

## 2015-03-21 ENCOUNTER — Encounter (HOSPITAL_COMMUNITY)
Admission: RE | Disposition: A | Discharge status: Home / Self Care | Payer: BLUE CROSS/BLUE SHIELD | Source: Ambulatory Visit | Attending: Obstetrics and Gynecology

## 2015-03-21 ENCOUNTER — Ambulatory Visit (HOSPITAL_COMMUNITY)
Admission: RE | Admit: 2015-03-21 | Discharge: 2015-03-21 | Disposition: A | Discharge status: Home / Self Care | Payer: BLUE CROSS/BLUE SHIELD | Source: Ambulatory Visit | Attending: Obstetrics and Gynecology | Admitting: Obstetrics and Gynecology

## 2015-03-21 ENCOUNTER — Encounter (HOSPITAL_COMMUNITY): Payer: Self-pay | Admitting: *Deleted

## 2015-03-21 DIAGNOSIS — F329 Major depressive disorder, single episode, unspecified: Secondary | ICD-10-CM | POA: Diagnosis not present

## 2015-03-21 DIAGNOSIS — Z91040 Latex allergy status: Secondary | ICD-10-CM | POA: Diagnosis not present

## 2015-03-21 DIAGNOSIS — Z539 Procedure and treatment not carried out, unspecified reason: Secondary | ICD-10-CM | POA: Diagnosis not present

## 2015-03-21 DIAGNOSIS — D259 Leiomyoma of uterus, unspecified: Secondary | ICD-10-CM | POA: Diagnosis not present

## 2015-03-21 DIAGNOSIS — N920 Excessive and frequent menstruation with regular cycle: Secondary | ICD-10-CM | POA: Insufficient documentation

## 2015-03-21 DIAGNOSIS — G610 Guillain-Barre syndrome: Secondary | ICD-10-CM | POA: Diagnosis not present

## 2015-03-21 LAB — ABO/RH: ABO/RH(D): O POS

## 2015-03-21 LAB — TYPE AND SCREEN
ABO/RH(D): O POS
Antibody Screen: NEGATIVE

## 2015-03-21 LAB — PREGNANCY, URINE: Preg Test, Ur: NEGATIVE

## 2015-03-21 SURGERY — Surgical Case
Anesthesia: General

## 2015-03-21 MED ORDER — ROCURONIUM BROMIDE 100 MG/10ML IV SOLN
INTRAVENOUS | Status: DC | PRN
  Administered 2015-03-21: 50 mg via INTRAVENOUS

## 2015-03-21 MED ORDER — FENTANYL CITRATE 0.05 MG/ML IJ SOLN
INTRAMUSCULAR | Status: AC
  Filled 2015-03-21: qty 2

## 2015-03-21 MED ORDER — FENTANYL CITRATE 0.05 MG/ML IJ SOLN
INTRAMUSCULAR | Status: DC | PRN
  Administered 2015-03-21: 50 ug via INTRAVENOUS
  Administered 2015-03-21: 100 ug via INTRAVENOUS

## 2015-03-21 MED ORDER — SODIUM CHLORIDE 0.9 % IJ SOLN
INTRAMUSCULAR | Status: AC
  Filled 2015-03-21: qty 10

## 2015-03-21 MED ORDER — NEOSTIGMINE METHYLSULFATE 10 MG/10ML IV SOLN
INTRAVENOUS | Status: AC
  Filled 2015-03-21: qty 1

## 2015-03-21 MED ORDER — MIDAZOLAM HCL 2 MG/2ML IJ SOLN
INTRAMUSCULAR | Status: AC
  Filled 2015-03-21: qty 2

## 2015-03-21 MED ORDER — MIDAZOLAM HCL 2 MG/2ML IJ SOLN: 0.5000 mg | Freq: Once | INTRAMUSCULAR | Status: DC | PRN

## 2015-03-21 MED ORDER — GLYCOPYRROLATE 0.2 MG/ML IJ SOLN
INTRAMUSCULAR | Status: AC
  Filled 2015-03-21: qty 3

## 2015-03-21 MED ORDER — ONDANSETRON HCL 4 MG/2ML IJ SOLN
INTRAMUSCULAR | Status: AC
  Filled 2015-03-21: qty 2

## 2015-03-21 MED ORDER — SODIUM CHLORIDE 0.9 % IJ SOLN
INTRAMUSCULAR | Status: AC
  Filled 2015-03-21: qty 50

## 2015-03-21 MED ORDER — PROPOFOL 10 MG/ML IV BOLUS
INTRAVENOUS | Status: DC | PRN
  Administered 2015-03-21: 200 mg via INTRAVENOUS

## 2015-03-21 MED ORDER — MEPERIDINE HCL 25 MG/ML IJ SOLN: 6.2500 mg | INTRAMUSCULAR | Status: DC | PRN

## 2015-03-21 MED ORDER — ROPIVACAINE HCL 5 MG/ML IJ SOLN
INTRAMUSCULAR | Status: AC
  Filled 2015-03-21: qty 60

## 2015-03-21 MED ORDER — ACETAMINOPHEN 160 MG/5ML PO SOLN
ORAL | Status: AC
  Filled 2015-03-21: qty 40.6

## 2015-03-21 MED ORDER — LACTATED RINGERS IV SOLN
INTRAVENOUS | Status: DC
  Administered 2015-03-21: 12:00:00 via INTRAVENOUS

## 2015-03-21 MED ORDER — DEXAMETHASONE SODIUM PHOSPHATE 4 MG/ML IJ SOLN
INTRAMUSCULAR | Status: AC
  Filled 2015-03-21: qty 1

## 2015-03-21 MED ORDER — MIDAZOLAM HCL 2 MG/2ML IJ SOLN
INTRAMUSCULAR | Status: DC | PRN
  Administered 2015-03-21: 2 mg via INTRAVENOUS

## 2015-03-21 MED ORDER — BUPIVACAINE HCL (PF) 0.25 % IJ SOLN
INTRAMUSCULAR | Status: AC
  Filled 2015-03-21: qty 30

## 2015-03-21 MED ORDER — ROCURONIUM BROMIDE 100 MG/10ML IV SOLN
INTRAVENOUS | Status: AC
  Filled 2015-03-21: qty 1

## 2015-03-21 MED ORDER — SODIUM CHLORIDE 0.9 % IV SOLN
INTRAVENOUS | Status: DC | PRN
  Administered 2015-03-21: 70 mL via INTRAMUSCULAR

## 2015-03-21 MED ORDER — PROMETHAZINE HCL 25 MG/ML IJ SOLN: 6.2500 mg | INTRAMUSCULAR | Status: DC | PRN

## 2015-03-21 MED ORDER — PROPOFOL 10 MG/ML IV BOLUS
INTRAVENOUS | Status: AC
  Filled 2015-03-21: qty 20

## 2015-03-21 MED ORDER — ACETAMINOPHEN 325 MG PO TABS: 325.0000 mg | ORAL_TABLET | ORAL | Status: DC | PRN

## 2015-03-21 MED ORDER — LIDOCAINE HCL (CARDIAC) 20 MG/ML IV SOLN
INTRAVENOUS | Status: AC
  Filled 2015-03-21: qty 5

## 2015-03-21 MED ORDER — FENTANYL CITRATE 0.05 MG/ML IJ SOLN
INTRAMUSCULAR | Status: AC
  Filled 2015-03-21: qty 5

## 2015-03-21 MED ORDER — SUGAMMADEX SODIUM 200 MG/2ML IV SOLN
INTRAVENOUS | Status: DC | PRN
  Administered 2015-03-21: 240 mg via INTRAVENOUS

## 2015-03-21 MED ORDER — MENTHOL 3 MG MT LOZG
1.0000 | LOZENGE | OROMUCOSAL | Status: DC | PRN
  Filled 2015-03-21: qty 9

## 2015-03-21 MED ORDER — VASOPRESSIN 20 UNIT/ML IV SOLN
INTRAVENOUS | Status: AC
  Filled 2015-03-21: qty 1

## 2015-03-21 MED ORDER — GLYCOPYRROLATE 0.2 MG/ML IJ SOLN
INTRAMUSCULAR | Status: DC | PRN
  Administered 2015-03-21: 0.2 mg via INTRAVENOUS

## 2015-03-21 MED ORDER — SCOPOLAMINE 1 MG/3DAYS TD PT72
MEDICATED_PATCH | TRANSDERMAL | Status: AC
  Administered 2015-03-21: 1.5 mg via TRANSDERMAL
  Filled 2015-03-21: qty 1

## 2015-03-21 MED ORDER — FENTANYL CITRATE 0.05 MG/ML IJ SOLN
25.0000 ug | INTRAMUSCULAR | Status: DC | PRN
  Administered 2015-03-21: 25 ug via INTRAVENOUS

## 2015-03-21 MED ORDER — DEXAMETHASONE SODIUM PHOSPHATE 10 MG/ML IJ SOLN
INTRAMUSCULAR | Status: DC | PRN
  Administered 2015-03-21: 20 mg via INTRAVENOUS

## 2015-03-21 MED ORDER — ACETAMINOPHEN 160 MG/5ML PO SOLN: 325.0000 mg | ORAL | Status: DC | PRN

## 2015-03-21 MED ORDER — KETOROLAC TROMETHAMINE 30 MG/ML IJ SOLN: 30.0000 mg | Freq: Once | INTRAMUSCULAR | Status: DC | PRN

## 2015-03-21 MED ORDER — SCOPOLAMINE 1 MG/3DAYS TD PT72
1.0000 | MEDICATED_PATCH | Freq: Once | TRANSDERMAL | Status: DC
  Administered 2015-03-21: 1.5 mg via TRANSDERMAL

## 2015-03-21 MED ORDER — LIDOCAINE HCL (CARDIAC) 20 MG/ML IV SOLN
INTRAVENOUS | Status: DC | PRN
Start: 2015-03-21 — End: 2015-03-21
  Administered 2015-03-21: 100 mg via INTRAVENOUS

## 2015-03-21 MED ORDER — ACETAMINOPHEN 160 MG/5ML PO SOLN
975.0000 mg | Freq: Once | ORAL | Status: AC
  Administered 2015-03-21: 975 mg via ORAL

## 2015-03-21 SURGICAL SUPPLY — 67 items
APL SKNCLS STERI-STRIP NONHPOA (GAUZE/BANDAGES/DRESSINGS) ×1
BARRIER ADHS 3X4 INTERCEED (GAUZE/BANDAGES/DRESSINGS) ×3 IMPLANT
BENZOIN TINCTURE PRP APPL 2/3 (GAUZE/BANDAGES/DRESSINGS) ×3 IMPLANT
BRR ADH 4X3 ABS CNTRL BYND (GAUZE/BANDAGES/DRESSINGS) ×1
CATH FOLEY 3WAY  5CC 18FR (CATHETERS) ×1
CATH FOLEY 3WAY 5CC 18FR (CATHETERS) ×2 IMPLANT
CHLORAPREP W/TINT 26ML (MISCELLANEOUS) ×3 IMPLANT
CLOTH BEACON ORANGE TIMEOUT ST (SAFETY) ×3 IMPLANT
CONT PATH 16OZ SNAP LID 3702 (MISCELLANEOUS) ×3 IMPLANT
COVER BACK TABLE 60X90IN (DRAPES) ×6 IMPLANT
COVER TIP SHEARS 8 DVNC (MISCELLANEOUS) ×2 IMPLANT
COVER TIP SHEARS 8MM DA VINCI (MISCELLANEOUS) ×1
DECANTER SPIKE VIAL GLASS SM (MISCELLANEOUS) ×6 IMPLANT
DRAPE WARM FLUID 44X44 (DRAPE) ×3 IMPLANT
DRSG COVADERM PLUS 2X2 (GAUZE/BANDAGES/DRESSINGS) ×12 IMPLANT
DRSG OPSITE POSTOP 3X4 (GAUZE/BANDAGES/DRESSINGS) ×3 IMPLANT
ELECT REM PT RETURN 9FT ADLT (ELECTROSURGICAL) ×2
ELECTRODE REM PT RTRN 9FT ADLT (ELECTROSURGICAL) ×2 IMPLANT
FILTER SMOKE EVAC LAPAROSHD (FILTER) ×3 IMPLANT
GAUZE VASELINE 3X9 (GAUZE/BANDAGES/DRESSINGS) IMPLANT
GLOVE BIOGEL PI IND STRL 7.0 (GLOVE) ×4 IMPLANT
GLOVE BIOGEL PI INDICATOR 7.0 (GLOVE) ×2
GLOVE ECLIPSE 6.5 STRL STRAW (GLOVE) ×9 IMPLANT
KIT ABG SYR 3ML LUER SLIP (SYRINGE) ×3 IMPLANT
KIT ACCESSORY DA VINCI DISP (KITS) ×1
KIT ACCESSORY DVNC DISP (KITS) ×2 IMPLANT
LEGGING LITHOTOMY PAIR STRL (DRAPES) ×3 IMPLANT
LIQUID BAND (GAUZE/BANDAGES/DRESSINGS) ×3 IMPLANT
MANIPULATOR UTERINE 4.5 ZUMI (MISCELLANEOUS) IMPLANT
NDL SPNL 22GX7 QUINCKE BK (NEEDLE) IMPLANT
NEEDLE HYPO 22GX1.5 SAFETY (NEEDLE) ×3 IMPLANT
NEEDLE INSUFFLATION 120MM (ENDOMECHANICALS) IMPLANT
NEEDLE SPNL 22GX7 QUINCKE BK (NEEDLE) IMPLANT
NS IRRIG 1000ML POUR BTL (IV SOLUTION) ×9 IMPLANT
PACK ROBOT WH (CUSTOM PROCEDURE TRAY) ×3 IMPLANT
PACK ROBOTIC GOWN (GOWN DISPOSABLE) ×3 IMPLANT
PAD POSITIONER PINK NONSTERILE (MISCELLANEOUS) ×3 IMPLANT
PORT ACCESS TROCAR AIRSEAL 12 (TROCAR) ×2 IMPLANT
PORT ACCESS TROCAR AIRSEAL 5M (TROCAR) ×1
SET CYSTO W/LG BORE CLAMP LF (SET/KITS/TRAYS/PACK) IMPLANT
SET IRRIG TUBING LAPAROSCOPIC (IRRIGATION / IRRIGATOR) ×3 IMPLANT
SET TRI-LUMEN FLTR TB AIRSEAL (TUBING) ×2 IMPLANT
STRIP CLOSURE SKIN 1/2X4 (GAUZE/BANDAGES/DRESSINGS) ×3 IMPLANT
SUT MNCRL AB 3-0 PS2 27 (SUTURE) ×6 IMPLANT
SUT VIC AB 0 CT1 27 (SUTURE)
SUT VIC AB 0 CT1 27XBRD ANTBC (SUTURE) IMPLANT
SUT VIC AB 0 CT2 27 (SUTURE) IMPLANT
SUT VIC AB 2-0 CT2 27 (SUTURE) ×3 IMPLANT
SUT VICRYL 0 UR6 27IN ABS (SUTURE) ×6 IMPLANT
SUT VLOC 180 0 9IN  GS21 (SUTURE) ×1
SUT VLOC 180 0 9IN GS21 (SUTURE) ×2 IMPLANT
SUT VLOC 180 2-0 6IN GS21 (SUTURE) ×3 IMPLANT
SYR 50ML LL SCALE MARK (SYRINGE) ×3 IMPLANT
SYRINGE 10CC LL (SYRINGE) IMPLANT
SYSTEM CONVERTIBLE TROCAR (TROCAR) IMPLANT
TIP UTERINE 5.1X6CM LAV DISP (MISCELLANEOUS) IMPLANT
TIP UTERINE 6.7X10CM GRN DISP (MISCELLANEOUS) IMPLANT
TIP UTERINE 6.7X6CM WHT DISP (MISCELLANEOUS) IMPLANT
TIP UTERINE 6.7X8CM BLUE DISP (MISCELLANEOUS) IMPLANT
TOWEL OR 17X24 6PK STRL BLUE (TOWEL DISPOSABLE) ×9 IMPLANT
TRAY FOLEY BAG SILVER LF 16FR (CATHETERS) ×3 IMPLANT
TROCAR 12M 150ML BLUNT (TROCAR) IMPLANT
TROCAR DISP BLADELESS 8 DVNC (TROCAR) IMPLANT
TROCAR DISP BLADELESS 8MM (TROCAR)
TROCAR OPTI TIP 12M 100M (ENDOMECHANICALS) IMPLANT
TROCAR PORT AIRSEAL 5X120 (TROCAR) IMPLANT
TROCAR XCEL 12X100 BLDLESS (ENDOMECHANICALS) ×3 IMPLANT

## 2015-03-21 NOTE — OR Nursing (Signed)
At 1350 Dr. Rudean Curt requested an ENT doctor to report to OR room 7 STAT for difficult intubation.  Vernona Rieger, RN, called Dr. Erik Obey STAT at [phone].  Dr Erik Obey returned STAT phone call at [phone] and reported to Dr. Rudean Curt.

## 2015-03-21 NOTE — Anesthesia Postprocedure Evaluation (Signed)
  Anesthesia Post-op Note  Patient: CORTNIE RINGEL  Procedure(s) Performed: * No procedures listed *  Patient Location: PACU  Anesthesia Type:General  Level of Consciousness: awake, alert  and oriented  Airway and Oxygen Therapy: Patient Spontanous Breathing  Post-op Pain: mild  Post-op Assessment: Post-op Vital signs reviewed, Patient's Cardiovascular Status Stable, Respiratory Function Stable, Patent Airway, No signs of Nausea or vomiting, Adequate PO intake and Pain level controlled  Post-op Vital Signs: Reviewed and stable  Last Vitals:  Filed Vitals:   03/21/15 1700  BP: 94/51  Pulse: 65  Temp:   Resp: 16    Complications: Unable to intubate patient for procedure. Patient allowed to emerge from [place] after muscle relaxants reversed with Sugammadex. Patient advised of difficult airway and urged to get Medic Alert bracelet or necklace. Will probably need awake fiberoptic intubation for any General Anesthetics requiring intubation. Patient understands. Will send difficult airway letter to patient.

## 2015-03-21 NOTE — Interval H&P Note (Signed)
History and Physical Interval Note:  03/21/2015 12:52 PM  Makayla Taylor  has presented today for surgery, with the diagnosis of Uterine Fibroids  The various methods of treatment have been discussed with the patient and family. After consideration of risks, benefits and other options for treatment, the patient has consented to  Procedure(s): ROBOTIC ASSISTED MYOMECTOMY with Morcellation (N/A) as a surgical intervention .  The patient's history has been reviewed, patient examined, no change in status, stable for surgery.  I have reviewed the patient's chart and labs.  Questions were answered to the patient's satisfaction.     Dorien Mayotte A

## 2015-03-21 NOTE — Discharge Instructions (Signed)

## 2015-03-21 NOTE — OR Nursing (Signed)
At 1355 Dr. Cletis Media, Dr Rudean Curt, and Dr. Royce Macadamia made a medical decision to wake the patient up and cancel surgical procedure.

## 2015-03-21 NOTE — OR Nursing (Addendum)
Dr. Rudean Curt asked for respiratory to be present in the OR room 7 for emgerence of the patient at 1400.  Chassity Nicole [facility] RN, called respiratory at 1400.  At [place] RT arrived to OR room 7 for patient emgerence.

## 2015-03-21 NOTE — OR Nursing (Signed)
Lavonna Rua, verified OR record could be completed with all medical information.  However, OR record could not be verified in EPIC related to the surgical procedure being cancelled.  Lavonna Rua phone number [phone].

## 2015-03-21 NOTE — H&P (View-Only) (Signed)
Makayla Taylor is a 44 y.o. female  female P 2-0-0-2 presents for a robot assisted myomectomy with morcellation because of menorrhagia.  The patient with know uterine fibroid, noticed that over the past year, her menstrual flow began to last 12-14 days.  During this time she would pass significant sized clots and change her heavy flow pad every 2-3 hours.  She reports some lower back discomfort with her bleeding, rated as 3/10 on a 10 point pain scale but no significant pelvic cramping.   She denies any post coital bleeding, dyspareunia, changes in bowel function or bladder complaints.  An endometrial biopys in November 2015 returned benign findings.  A pelvic ultrasound/sono-hysterogram at the same time revealed: uterus: 8.11 x 6.82 x 7.54 cm, endometrium-0.688 cm and a left anterior intramural fibroid-5.6 x 5.4 x 6.4 cm that with saline infusion was shown not to have a submucosal component.  Right ovary-2.84 x 1.99 x 2.12 cm and left ovary-2.76 x 1.93 x 1.44 cm.  In addition the patient's TSH and CBC were normal.  Over the course of the year the patient did not pursue medical management of her symptoms.  After a review of both the medical and surgical mangaement options the patient has decided to proceed with myomectomy.  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  ROS: Denies corrective lenses,  headache, vision changes, nasal congestion, dysphagia, tinnitus, dizziness, hoarseness, cough,  chest pain, shortness of breath, nausea, vomiting, diarrhea,constipation,  urinary frequency, urgency  dysuria, hematuria, vaginitis symptoms, pelvic pain, swelling of joints,easy bruising,  myalgias, arthralgias, skin rashes, unexplained weight loss and except as is mentioned in the history of present illness, patient's review of systems is otherwise negative.   Physical Exam  Bp: 92/58       P: 68     R:  17       Temperature: 98.6 degrees F orally     Weight: 137 lbs.      Height: 5\' 6"      BMI: 22.1  Neck: supple without masses or thyromegaly Lungs: clear to auscultation Heart: regular rate and rhythm Abdomen: soft, non-tender with firm mass from pelvis to 3 fingers above symphysis pubis;  no organomegaly Pelvic:EGBUS- wnl; vagina-normal rugae; uterus-14 weeks size, cervix without lesions or motion tenderness; adnexae-no tenderness or masses Extremities:  no clubbing, cyanosis or edema   Assesment: Symptomatic Fibroid            Menorrhagia   Disposition:  Robotically-assisted myomectomy was reviewed with  the patient.   Benefits of the robotic approach include lesser post-operative pain, less blood loss during surgery, reduced risk of injury to other organs due to better visualization with a 3-D HD 10 times magnifying camera, shorter hospital stay between 0-1 night and rapid recovery with return to daily routine in 2-3 weeks. Although robotically-assisted myomectomy has a longer operative time than traditional  laparotomy, in a patient with good medical history, the benefits usually outweigh the risks.   Risks include but are not limited to bleeding, infection, injury to other organs, need for laparotomy and transient post-operative facial edema.   Patient informed about FDA warning on use of morcellator dated 04/15/2013.  Discussed:  1. Incidence of post-operative diagnosis of sarcoma in women undergoing a hysterectomy is 2:1000 2. Annual incidence of leiomyosarcoma is 0.64/100,000 women 3. Sarcomas have the highest incidence in women over 65 4. Power morcellation involves risks of spreading tissue / disease. In he case of undiagnosed cancer, it may adversely affect the patient's prognosis.  Also discussed:  1.Lack of tactile feedback with the robotic approach leaving non-visible fibroids impossible to resect with potential future growth.  2. Need for cesarean section if there is uterine cavity entry and/or multiple incisions  3. Risk of developing NEW fibroids estimated at 25 %  Patient voices understanding and desires to proceed as planned with a Robot Assist Laparoscopic Myomectomy with Morcellation at [place] on March 21, 2015 at  1 p.m.      CSN# [id number]   Dilana Mcphie J. Florene Glen, PA-C  for Dr. Dede Query. [place]

## 2015-03-21 NOTE — Transfer of Care (Signed)
Immediate Anesthesia Transfer of Care Note  Patient: Makayla Taylor  Procedure(s) Performed: * No procedures listed *  Patient Location: PACU  Anesthesia Type:General  Level of Consciousness: awake, alert  and oriented  Airway & Oxygen Therapy: Patient Spontanous Breathing and Patient connected to face mask oxygen  Post-op Assessment: Report given to RN and Post -op Vital signs reviewed and stable  Post vital signs: Reviewed and stable  Last Vitals:  Filed Vitals:   03/21/15 1417  BP:   Pulse: 87  Temp: 37.1 C  Resp:     Complications: No apparent anesthesia complications

## 2015-03-21 NOTE — Anesthesia Procedure Notes (Signed)
Procedure Name: Intubation Date/Time: 03/21/2015 1:22 PM Performed by: [employer], Sheron Nightingale Pre-anesthesia Checklist: Patient identified, Timeout performed, Emergency Drugs available, Suction available and Patient being monitored Patient Re-evaluated:Patient Re-evaluated prior to inductionOxygen Delivery Method: Circle system utilized Preoxygenation: Pre-oxygenation with 100% oxygen Intubation Type: IV induction Ventilation: Two handed mask ventilation required and Unable to mask ventilate Laryngoscope Size: Glidescope Grade View: Grade IV Tube type: Glide rite Airway Equipment and Method: Video-laryngoscopy Difficulty Due To: Difficulty was anticipated and Difficult Airway- due to anterior larynx Future Recommendations: Recommend- induction with short-acting agent, and alternative techniques readily available and Recommend- awake intubation

## 2015-03-22 MED FILL — Sugammadex Sodium IV 500 MG/5ML (Base Equivalent): INTRAVENOUS | Qty: 5 | Status: AC

## 2015-04-06 NOTE — Op Note (Signed)
Preoperative diagnosis: Symptomatic uterine fibroids   Postoperative diagnosis: Same   Anesthesia: General   Anesthesiologist: Dr. Rudean Curt, Dr Josephine Igo  Procedure: Robotically assisted myomectomy was cancelled due to inability to intubate the patient after induction of general anesthesia despite multiple attempts. Decision was made to wake up the patient and cancel surgery. To be noted, throughout this process, patient's vital signs and O2 saturation always remained normal. Events were reviewed with patient and family with Dr Glennon Mac and myself. Will plan outpatient evaluation with ENT and R/S procedure.  Surgeon: Dr. Katharine Look Shirle Provencal

## 2015-04-10 ENCOUNTER — Encounter (HOSPITAL_COMMUNITY): Payer: Self-pay | Admitting: Obstetrics and Gynecology

## 2015-04-17 ENCOUNTER — Inpatient Hospital Stay (HOSPITAL_COMMUNITY): Admission: RE | Admit: 2015-04-17 | Payer: BLUE CROSS/BLUE SHIELD | Source: Ambulatory Visit

## 2015-04-19 ENCOUNTER — Other Ambulatory Visit: Payer: Self-pay | Admitting: Hematology and Oncology

## 2015-04-19 ENCOUNTER — Encounter: Payer: Self-pay | Admitting: Hematology and Oncology

## 2015-04-19 DIAGNOSIS — D61818 Other pancytopenia: Secondary | ICD-10-CM

## 2015-04-19 HISTORY — DX: Other pancytopenia: D61.818

## 2015-04-20 ENCOUNTER — Other Ambulatory Visit: Payer: BLUE CROSS/BLUE SHIELD

## 2015-04-20 ENCOUNTER — Ambulatory Visit: Payer: BLUE CROSS/BLUE SHIELD

## 2015-04-20 ENCOUNTER — Ambulatory Visit: Payer: BLUE CROSS/BLUE SHIELD | Admitting: Hematology and Oncology

## 2015-04-21 ENCOUNTER — Other Ambulatory Visit: Payer: Self-pay | Admitting: Obstetrics and Gynecology

## 2015-04-28 NOTE — Addendum Note (Signed)
Addendum  created 04/28/15 1153 by Josephine Igo, MD   Modules edited: Anesthesia Responsible Staff

## 2015-05-16 NOTE — Patient Instructions (Addendum)
   Your procedure is scheduled on:  Tuesday, May 24  Enter through the Main Entrance of [facility] at: [place] up the phone at the desk and dial [phone] and inform us of your arrival.  Please call this number if you have any problems the morning of surgery: [phone]  Remember: Do not eat food after midnight: Monday Do not drink clear liquids after: 6 AM Tuesday, day of surgery Take these medicines the morning of surgery with a SIP OF WATER:  NONE  Do not wear jewelry, make-up, or FINGER nail polish No metal in your hair or on your body. Do not wear lotions, powders, perfumes.  You may wear deodorant.  Do not bring valuables to the hospital. Contacts, dentures or bridgework may not be worn into surgery.  Patients discharged on the day of surgery will not be allowed to drive home.  Home with husband Sherren Mocha cell [phone] or friend Adelina Mings cell [phone]

## 2015-05-17 ENCOUNTER — Encounter (HOSPITAL_COMMUNITY): Payer: Self-pay

## 2015-05-17 ENCOUNTER — Encounter (HOSPITAL_COMMUNITY)
Admission: RE | Admit: 2015-05-17 | Discharge: 2015-05-17 | Disposition: A | Discharge status: Home / Self Care | Payer: BLUE CROSS/BLUE SHIELD | Source: Ambulatory Visit | Attending: Obstetrics and Gynecology | Admitting: Obstetrics and Gynecology

## 2015-05-17 DIAGNOSIS — Z01812 Encounter for preprocedural laboratory examination: Secondary | ICD-10-CM | POA: Insufficient documentation

## 2015-05-17 LAB — CBC
HCT: 33.1 % — ABNORMAL LOW (ref 36.0–46.0)
Hemoglobin: 11.2 g/dL — ABNORMAL LOW (ref 12.0–15.0)
MCH: 28.9 pg (ref 26.0–34.0)
MCHC: 33.8 g/dL (ref 30.0–36.0)
MCV: 85.5 fL (ref 78.0–100.0)
Platelets: 161 10*3/uL (ref 150–400)
RBC: 3.87 MIL/uL (ref 3.87–5.11)
RDW: 15.6 % — ABNORMAL HIGH (ref 11.5–15.5)
WBC: 4.9 10*3/uL (ref 4.0–10.5)

## 2015-05-17 LAB — BASIC METABOLIC PANEL
Anion gap: 9 (ref 5–15)
BUN: 13 mg/dL (ref 6–20)
CO2: 27 mmol/L (ref 22–32)
Calcium: 9.3 mg/dL (ref 8.9–10.3)
Chloride: 102 mmol/L (ref 101–111)
Creatinine, Ser: 0.78 mg/dL (ref 0.44–1.00)
GFR calc Af Amer: >60 mL/min (ref >60)
GFR calc non Af Amer: >60 mL/min (ref >60)
Glucose, Bld: 98 mg/dL (ref 65–99)
Potassium: 3.8 mmol/L (ref 3.5–5.1)
Sodium: 138 mmol/L (ref 135–145)

## 2015-05-17 NOTE — Pre-Procedure Instructions (Signed)
Dr. Royce Macadamia say patient at PAT appt.  No orders given.  [place] for surgery.

## 2015-05-17 NOTE — Pre-Procedure Instructions (Signed)
Patient had CHG wash at home from 02/2015 surgery, over half a bottle left.  Patient did not want another bottle of CHG. None give at PAT appt.

## 2015-05-19 ENCOUNTER — Other Ambulatory Visit (HOSPITAL_COMMUNITY): Payer: Self-pay | Admitting: Obstetrics and Gynecology

## 2015-05-19 NOTE — H&P (Signed)
Makayla Taylor is a 44 y.o. female female P 2-0-0-2 presents for a robot assisted myomectomy with morcellation because of menorrhagia. The patient with know uterine fibroid, noticed that over the past year, her menstrual flow began to last 12-14 days. During this time she would pass significant sized clots and change her heavy flow pad every 2-3 hours. She reports some lower back discomfort with her bleeding, rated as 3/10 on a 10 point pain scale but no significant pelvic cramping. She denies any post coital bleeding, dyspareunia, changes in bowel function or bladder complaints. An endometrial biopys in November 2015 returned benign findings. A pelvic ultrasound/sono-hysterogram at the same time revealed: uterus: 8.11 x 6.82 x 7.54 cm, endometrium-0.688 cm and a left anterior intramural fibroid-5.6 x 5.4 x 6.4 cm that with saline infusion was shown not to have a submucosal component. Right ovary-2.84 x 1.99 x 2.12 cm and left ovary-2.76 x 1.93 x 1.44 cm. In addition the patient's TSH and CBC were normal. Over the course of the year the patient did not pursue medical management of her symptoms. After a review of both the medical and surgical mangaement options the patient has decided to proceed with myomectomy.  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  ROS: Denies corrective lenses, headache, vision changes, nasal congestion, dysphagia, tinnitus, dizziness, hoarseness, cough, chest pain, shortness of breath, nausea, vomiting, diarrhea,constipation, urinary frequency, urgency dysuria, hematuria, vaginitis symptoms, pelvic pain, swelling of joints,easy bruising, myalgias, arthralgias, skin rashes, unexplained weight loss and except as is mentioned in the history of present illness, patient's review of systems is otherwise negative.   Physical Exam  Bp: 112/58 P: 68 R: 17 Temperature: 98.6 degrees F orally Weight: 136 lbs. Height: 5\' 6"  BMI: 22  Neck: supple without masses or thyromegaly Lungs: clear to auscultation Heart: regular rate and rhythm Abdomen: soft, non-tender with firm mass from pelvis to 3 fingers above symphysis pubis; no organomegaly Pelvic:EGBUS- wnl; vagina-normal rugae; uterus-14 weeks size, cervix without lesions or motion tenderness; adnexae-no tenderness or masses Extremities: no clubbing, cyanosis or edema   Assesment: Symptomatic Fibroid  Menorrhagia   Disposition: Robotically-assisted myomectomy was reviewed with the patient.   Benefits of the robotic approach include lesser post-operative pain, less blood loss during surgery, reduced risk of injury to other organs due to better visualization with a 3-D HD 10 times magnifying camera, shorter hospital stay between 0-1 night and rapid recovery with return to daily routine in 2-3 weeks. Although robotically-assisted myomectomy has a longer operative  time than traditional laparotomy, in a patient with good medical history, the benefits usually outweigh the  risks.   Risks include but are not limited to bleeding, infection, injury to other organs, need for laparotomy and transient post-operative facial edema.   Patient informed about FDA warning on use of morcellator dated 04/15/2013.  Discussed:  1. Incidence of post-operative diagnosis of sarcoma in women undergoing a hysterectomy is 2:1000 2. Annual incidence of leiomyosarcoma is 0.64/100,000 women 3. Sarcomas have the highest incidence in women over 65 4. Power morcellation involves risks of spreading tissue / disease. In he case of undiagnosed cancer, it may adversely affect the patient's prognosis.  Also discussed:  1.Lack of tactile feedback with the robotic approach leaving non-visible fibroids impossible to resect with potential future growth.  2. Need for cesarean section if there is uterine cavity entry and/or multiple incisions  3. Risk of developing NEW fibroids estimated at 25 %  Patient voices understanding and desires to proceed as planned with a Robot Assist Laparoscopic Myomectomy with Morcellation at [place] on May 23, 2015 at 12:30 p.m.          CSN# [id number]   [place] Cornia J. Florene Glen, PA-C  for Dr. Dede Query.Rivard

## 2015-05-22 MED ORDER — DEXTROSE 5 % IV SOLN
2.0000 g | INTRAVENOUS | Status: AC
  Administered 2015-05-23: 2 g via INTRAVENOUS
  Filled 2015-05-22: qty 2

## 2015-05-22 NOTE — Anesthesia Preprocedure Evaluation (Addendum)
Anesthesia Evaluation  Patient identified by MRN, date of birth, ID band Patient awake    Reviewed: Allergy & Precautions, NPO status , Patient's Chart, lab work & pertinent test results  History of Anesthesia Complications (+) DIFFICULT AIRWAY and history of anesthetic complications  Airway Mallampati: IV  TM Distance: <3 FB Neck ROM: Limited  Mouth opening: Limited Mouth Opening  Dental no notable dental hx. (+) Teeth Intact   Pulmonary  breath sounds clear to auscultation  Pulmonary exam normal       Cardiovascular negative cardio ROS Normal cardiovascular examRhythm:Regular Rate:Normal  Had hemangioma mastoid area age1 S/P Radiation Rx   Neuro/Psych PSYCHIATRIC DISORDERS Depression Hx/o GBS no residual  Neuromuscular disease    GI/Hepatic negative GI ROS, Neg liver ROS,   Endo/Other  negative endocrine ROS  Renal/GU negative Renal ROS  negative genitourinary   Musculoskeletal negative musculoskeletal ROS (+)   Abdominal   Peds  Hematology  (+) anemia ,   Anesthesia Other Findings Patient has limited mandibular ROM, receding mandible, TMJ dysfunction, limited mouth opening, and limited neck extension.  Reproductive/Obstetrics Fibroid uterus Menorrhagia                            Anesthesia Physical Anesthesia Plan  ASA: II  Anesthesia Plan: General   Post-op Pain Management:    Induction: Intravenous  Airway Management Planned: Oral ETT, Nasal ETT, Fiberoptic Intubation Planned and Awake Intubation Planned  Additional Equipment:   Intra-op Plan:   Post-operative Plan: Extubation in OR  Informed Consent: I have reviewed the patients History and Physical, chart, labs and discussed the procedure including the risks, benefits and alternatives for the proposed anesthesia with the patient or authorized representative who has indicated his/her understanding and acceptance.    Dental advisory given  Plan Discussed with: CRNA, Anesthesiologist and Surgeon  Anesthesia Plan Comments: (Will do awake fiberoptic intubation after topicalization and administration of antisialogue. She may be easier to intubate via NT route. Discussed in detail with patient.)        Anesthesia Quick Evaluation

## 2015-05-23 ENCOUNTER — Encounter (HOSPITAL_COMMUNITY)
Admission: RE | Disposition: A | Discharge status: Home / Self Care | Payer: Self-pay | Source: Ambulatory Visit | Attending: Obstetrics and Gynecology

## 2015-05-23 ENCOUNTER — Ambulatory Visit (HOSPITAL_COMMUNITY): Payer: BLUE CROSS/BLUE SHIELD | Admitting: Anesthesiology

## 2015-05-23 ENCOUNTER — Ambulatory Visit (HOSPITAL_COMMUNITY)
Admission: RE | Admit: 2015-05-23 | Discharge: 2015-05-23 | Disposition: A | Discharge status: Home / Self Care | Payer: BLUE CROSS/BLUE SHIELD | Source: Ambulatory Visit | Attending: Obstetrics and Gynecology | Admitting: Obstetrics and Gynecology

## 2015-05-23 ENCOUNTER — Encounter (HOSPITAL_COMMUNITY): Payer: Self-pay | Admitting: Registered Nurse

## 2015-05-23 DIAGNOSIS — D259 Leiomyoma of uterus, unspecified: Secondary | ICD-10-CM | POA: Diagnosis present

## 2015-05-23 DIAGNOSIS — G610 Guillain-Barre syndrome: Secondary | ICD-10-CM | POA: Diagnosis not present

## 2015-05-23 DIAGNOSIS — F329 Major depressive disorder, single episode, unspecified: Secondary | ICD-10-CM | POA: Diagnosis not present

## 2015-05-23 DIAGNOSIS — Z91040 Latex allergy status: Secondary | ICD-10-CM | POA: Insufficient documentation

## 2015-05-23 DIAGNOSIS — N920 Excessive and frequent menstruation with regular cycle: Secondary | ICD-10-CM | POA: Insufficient documentation

## 2015-05-23 HISTORY — PX: ROBOT ASSISTED MYOMECTOMY: SHX5142

## 2015-05-23 LAB — TYPE AND SCREEN
ABO/RH(D): O POS
Antibody Screen: NEGATIVE

## 2015-05-23 LAB — PREGNANCY, URINE: Preg Test, Ur: NEGATIVE

## 2015-05-23 SURGERY — ROBOTIC ASSISTED MYOMECTOMY
Anesthesia: General | Site: Abdomen

## 2015-05-23 MED ORDER — GLYCOPYRROLATE 0.2 MG/ML IJ SOLN
INTRAMUSCULAR | Status: AC
  Filled 2015-05-23: qty 1

## 2015-05-23 MED ORDER — SCOPOLAMINE 1 MG/3DAYS TD PT72
1.0000 | MEDICATED_PATCH | Freq: Once | TRANSDERMAL | Status: DC
  Administered 2015-05-23: 1.5 mg via TRANSDERMAL

## 2015-05-23 MED ORDER — IBUPROFEN 800 MG PO TABS: ORAL_TABLET | ORAL | Status: DC

## 2015-05-23 MED ORDER — LIDOCAINE HCL 2 % EX GEL: 1.0000 "application " | Freq: Once | CUTANEOUS | Status: DC

## 2015-05-23 MED ORDER — SODIUM CHLORIDE 0.9 % IJ SOLN
INTRAMUSCULAR | Status: AC
  Filled 2015-05-23: qty 50

## 2015-05-23 MED ORDER — FENTANYL CITRATE (PF) 250 MCG/5ML IJ SOLN
INTRAMUSCULAR | Status: AC
  Filled 2015-05-23: qty 5

## 2015-05-23 MED ORDER — PROPOFOL 10 MG/ML IV BOLUS
INTRAVENOUS | Status: DC | PRN
  Administered 2015-05-23: 20 mg via INTRAVENOUS
  Administered 2015-05-23: 150 mg via INTRAVENOUS
  Administered 2015-05-23: 10 mg via INTRAVENOUS
  Administered 2015-05-23: 20 mg via INTRAVENOUS

## 2015-05-23 MED ORDER — SCOPOLAMINE 1 MG/3DAYS TD PT72
MEDICATED_PATCH | TRANSDERMAL | Status: AC
  Administered 2015-05-23: 1.5 mg via TRANSDERMAL
  Filled 2015-05-23: qty 1

## 2015-05-23 MED ORDER — ROPIVACAINE HCL 5 MG/ML IJ SOLN
INTRAMUSCULAR | Status: AC
  Filled 2015-05-23: qty 30

## 2015-05-23 MED ORDER — NEOSTIGMINE METHYLSULFATE 10 MG/10ML IV SOLN
INTRAVENOUS | Status: DC | PRN
  Administered 2015-05-23: 3 mg via INTRAVENOUS

## 2015-05-23 MED ORDER — ROCURONIUM BROMIDE 100 MG/10ML IV SOLN
INTRAVENOUS | Status: AC
  Filled 2015-05-23: qty 1

## 2015-05-23 MED ORDER — MENTHOL 3 MG MT LOZG: 1.0000 | LOZENGE | OROMUCOSAL | Status: DC | PRN

## 2015-05-23 MED ORDER — FENTANYL CITRATE (PF) 100 MCG/2ML IJ SOLN
25.0000 ug | INTRAMUSCULAR | Status: DC | PRN
  Administered 2015-05-23 (×2): 50 ug via INTRAVENOUS

## 2015-05-23 MED ORDER — FENTANYL CITRATE (PF) 100 MCG/2ML IJ SOLN
INTRAMUSCULAR | Status: AC
  Filled 2015-05-23: qty 2

## 2015-05-23 MED ORDER — KETOROLAC TROMETHAMINE 30 MG/ML IJ SOLN: 30.0000 mg | Freq: Four times a day (QID) | INTRAMUSCULAR | Status: DC

## 2015-05-23 MED ORDER — DEXAMETHASONE SODIUM PHOSPHATE 4 MG/ML IJ SOLN
INTRAMUSCULAR | Status: DC | PRN
  Administered 2015-05-23: 4 mg via INTRAVENOUS

## 2015-05-23 MED ORDER — PROPOFOL 10 MG/ML IV BOLUS
INTRAVENOUS | Status: AC
  Filled 2015-05-23: qty 20

## 2015-05-23 MED ORDER — LIDOCAINE VISCOUS 2 % MT SOLN
15.0000 mL | Freq: Once | OROMUCOSAL | Status: DC
  Filled 2015-05-23: qty 15

## 2015-05-23 MED ORDER — ONDANSETRON HCL 4 MG PO TABS: 4.0000 mg | ORAL_TABLET | Freq: Three times a day (TID) | ORAL | Status: DC | PRN

## 2015-05-23 MED ORDER — MIDAZOLAM HCL 2 MG/2ML IJ SOLN
INTRAMUSCULAR | Status: AC
  Filled 2015-05-23: qty 2

## 2015-05-23 MED ORDER — GLYCOPYRROLATE 0.2 MG/ML IJ SOLN
0.1000 mg | Freq: Once | INTRAMUSCULAR | Status: DC
Start: 2015-05-23 — End: 2015-05-23
  Filled 2015-05-23: qty 0.5

## 2015-05-23 MED ORDER — SODIUM CHLORIDE 0.9 % IV SOLN
INTRAVENOUS | Status: DC | PRN
  Administered 2015-05-23: 60 mL

## 2015-05-23 MED ORDER — OXYCODONE-ACETAMINOPHEN 5-325 MG PO TABS: 1.0000 | ORAL_TABLET | Freq: Four times a day (QID) | ORAL | Status: DC | PRN

## 2015-05-23 MED ORDER — MIDAZOLAM HCL 5 MG/5ML IJ SOLN
INTRAMUSCULAR | Status: DC | PRN
  Administered 2015-05-23 (×2): 1 mg via INTRAVENOUS

## 2015-05-23 MED ORDER — VASOPRESSIN 20 UNIT/ML IV SOLN
INTRAVENOUS | Status: DC | PRN
  Administered 2015-05-23: 30 mL via INTRAMUSCULAR

## 2015-05-23 MED ORDER — GLYCOPYRROLATE 0.2 MG/ML IJ SOLN
0.1000 mg | Freq: Once | INTRAMUSCULAR | Status: AC
  Administered 2015-05-23: 0.2 mg via INTRAVENOUS
  Filled 2015-05-23: qty 0.5

## 2015-05-23 MED ORDER — ARTIFICIAL TEARS OP OINT
TOPICAL_OINTMENT | OPHTHALMIC | Status: AC
  Filled 2015-05-23: qty 3.5

## 2015-05-23 MED ORDER — LACTATED RINGERS IV SOLN
INTRAVENOUS | Status: DC
  Administered 2015-05-23: 12:00:00 via INTRAVENOUS

## 2015-05-23 MED ORDER — BUPIVACAINE HCL (PF) 0.25 % IJ SOLN
INTRAMUSCULAR | Status: AC
  Filled 2015-05-23: qty 30

## 2015-05-23 MED ORDER — ARTIFICIAL TEARS OP OINT
TOPICAL_OINTMENT | OPHTHALMIC | Status: DC | PRN
  Administered 2015-05-23: 1 via OPHTHALMIC

## 2015-05-23 MED ORDER — LACTATED RINGERS IV SOLN
INTRAVENOUS | Status: DC | PRN
  Administered 2015-05-23: 13:00:00 via INTRAVENOUS

## 2015-05-23 MED ORDER — GLYCOPYRROLATE 0.2 MG/ML IJ SOLN
0.2000 mg | Freq: Once | INTRAMUSCULAR | Status: AC
  Administered 2015-05-23: 0.2 mg via INTRAVENOUS
  Filled 2015-05-23: qty 1

## 2015-05-23 MED ORDER — GLYCOPYRROLATE 0.2 MG/ML IJ SOLN
INTRAMUSCULAR | Status: DC | PRN
  Administered 2015-05-23: 0.6 mg via INTRAVENOUS

## 2015-05-23 MED ORDER — DEXAMETHASONE SODIUM PHOSPHATE 4 MG/ML IJ SOLN
INTRAMUSCULAR | Status: AC
  Filled 2015-05-23: qty 1

## 2015-05-23 MED ORDER — GLYCOPYRROLATE 0.2 MG/ML IJ SOLN
INTRAMUSCULAR | Status: AC
  Administered 2015-05-23: 0.2 mg via INTRAVENOUS
  Filled 2015-05-23: qty 1

## 2015-05-23 MED ORDER — LACTATED RINGERS IV SOLN
INTRAVENOUS | Status: DC
Start: 2015-05-23 — End: 2015-05-24
  Administered 2015-05-23: 19:00:00 via INTRAVENOUS

## 2015-05-23 MED ORDER — ONDANSETRON HCL 4 MG/2ML IJ SOLN
INTRAMUSCULAR | Status: AC
  Filled 2015-05-23: qty 2

## 2015-05-23 MED ORDER — FENTANYL CITRATE (PF) 100 MCG/2ML IJ SOLN
INTRAMUSCULAR | Status: DC | PRN
  Administered 2015-05-23 (×2): 50 ug via INTRAVENOUS
  Administered 2015-05-23: 25 ug via INTRAVENOUS
  Administered 2015-05-23 (×3): 50 ug via INTRAVENOUS

## 2015-05-23 MED ORDER — STERILE WATER FOR IRRIGATION IR SOLN
Status: DC | PRN
  Administered 2015-05-23: 3000 mL

## 2015-05-23 MED ORDER — OXYMETAZOLINE HCL 0.05 % NA SOLN
1.0000 | Freq: Once | NASAL | Status: DC
  Filled 2015-05-23: qty 15

## 2015-05-23 MED ORDER — ONDANSETRON HCL 4 MG/2ML IJ SOLN
INTRAMUSCULAR | Status: DC | PRN
  Administered 2015-05-23: 4 mg via INTRAVENOUS

## 2015-05-23 MED ORDER — PROMETHAZINE HCL 25 MG/ML IJ SOLN: 6.2500 mg | INTRAMUSCULAR | Status: DC | PRN

## 2015-05-23 MED ORDER — LIDOCAINE HCL (CARDIAC) 20 MG/ML IV SOLN
INTRAVENOUS | Status: AC
  Filled 2015-05-23: qty 5

## 2015-05-23 MED ORDER — KETOROLAC TROMETHAMINE 30 MG/ML IJ SOLN: 30.0000 mg | Freq: Once | INTRAMUSCULAR | Status: DC

## 2015-05-23 MED ORDER — ROCURONIUM BROMIDE 100 MG/10ML IV SOLN
INTRAVENOUS | Status: DC | PRN
  Administered 2015-05-23: 20 mg via INTRAVENOUS
  Administered 2015-05-23: 10 mg via INTRAVENOUS
  Administered 2015-05-23: 20 mg via INTRAVENOUS
  Administered 2015-05-23: 10 mg via INTRAVENOUS
  Administered 2015-05-23: 50 mg via INTRAVENOUS

## 2015-05-23 MED ORDER — LIDOCAINE HCL 2 % IJ SOLN
1.0000 mL | Freq: Once | INTRAMUSCULAR | Status: DC
  Filled 2015-05-23: qty 10

## 2015-05-23 MED ORDER — OXYCODONE-ACETAMINOPHEN 5-325 MG PO TABS
1.0000 | ORAL_TABLET | ORAL | Status: DC | PRN
  Administered 2015-05-23: 1 via ORAL
  Filled 2015-05-23: qty 1

## 2015-05-23 MED ORDER — VASOPRESSIN 20 UNIT/ML IV SOLN
INTRAVENOUS | Status: AC
  Filled 2015-05-23: qty 1

## 2015-05-23 MED ORDER — LACTATED RINGERS IR SOLN
Status: DC | PRN
  Administered 2015-05-23: 3000 mL

## 2015-05-23 SURGICAL SUPPLY — 69 items
APL SKNCLS STERI-STRIP NONHPOA (GAUZE/BANDAGES/DRESSINGS) ×1
BARRIER ADHS 3X4 INTERCEED (GAUZE/BANDAGES/DRESSINGS) ×2 IMPLANT
BENZOIN TINCTURE PRP APPL 2/3 (GAUZE/BANDAGES/DRESSINGS) ×2 IMPLANT
BRR ADH 4X3 ABS CNTRL BYND (GAUZE/BANDAGES/DRESSINGS) ×1
CATH FOLEY 3WAY  5CC 18FR (CATHETERS) ×1
CATH FOLEY 3WAY 5CC 18FR (CATHETERS) ×1 IMPLANT
CHLORAPREP W/TINT 26ML (MISCELLANEOUS) ×2 IMPLANT
CLOTH BEACON ORANGE TIMEOUT ST (SAFETY) ×2 IMPLANT
CONT PATH 16OZ SNAP LID 3702 (MISCELLANEOUS) ×2 IMPLANT
COVER BACK TABLE 60X90IN (DRAPES) ×4 IMPLANT
COVER TIP SHEARS 8 DVNC (MISCELLANEOUS) ×1 IMPLANT
COVER TIP SHEARS 8MM DA VINCI (MISCELLANEOUS) ×1
DECANTER SPIKE VIAL GLASS SM (MISCELLANEOUS) ×4 IMPLANT
DRAPE WARM FLUID 44X44 (DRAPE) ×2 IMPLANT
DRSG COVADERM PLUS 2X2 (GAUZE/BANDAGES/DRESSINGS) ×8 IMPLANT
DRSG OPSITE POSTOP 3X4 (GAUZE/BANDAGES/DRESSINGS) ×2 IMPLANT
ELECT REM PT RETURN 9FT ADLT (ELECTROSURGICAL) ×2
ELECTRODE REM PT RTRN 9FT ADLT (ELECTROSURGICAL) ×1 IMPLANT
FILTER SMOKE EVAC LAPAROSHD (FILTER) ×2 IMPLANT
GAUZE VASELINE 3X9 (GAUZE/BANDAGES/DRESSINGS) IMPLANT
GLOVE BIOGEL PI IND STRL 7.0 (GLOVE) ×2 IMPLANT
GLOVE BIOGEL PI INDICATOR 7.0 (GLOVE) ×2
GLOVE ECLIPSE 6.5 STRL STRAW (GLOVE) ×6 IMPLANT
KIT ABG SYR 3ML LUER SLIP (SYRINGE) ×2 IMPLANT
KIT ACCESSORY DA VINCI DISP (KITS) ×1
KIT ACCESSORY DVNC DISP (KITS) ×1 IMPLANT
LEGGING LITHOTOMY PAIR STRL (DRAPES) ×3 IMPLANT
LIQUID BAND (GAUZE/BANDAGES/DRESSINGS) ×2 IMPLANT
MANIPULATOR UTERINE 4.5 ZUMI (MISCELLANEOUS) ×1 IMPLANT
NDL SPNL 22GX7 QUINCKE BK (NEEDLE) IMPLANT
NEEDLE HYPO 22GX1.5 SAFETY (NEEDLE) ×2 IMPLANT
NEEDLE INSUFFLATION 120MM (ENDOMECHANICALS) ×1 IMPLANT
NEEDLE SPNL 22GX7 QUINCKE BK (NEEDLE) IMPLANT
NS IRRIG 1000ML POUR BTL (IV SOLUTION) ×6 IMPLANT
PACK ROBOT WH (CUSTOM PROCEDURE TRAY) ×2 IMPLANT
PACK ROBOTIC GOWN (GOWN DISPOSABLE) ×2 IMPLANT
PAD POSITIONER PINK NONSTERILE (MISCELLANEOUS) ×2 IMPLANT
PORT ACCESS TROCAR AIRSEAL 12 (TROCAR) ×1 IMPLANT
PORT ACCESS TROCAR AIRSEAL 5M (TROCAR) ×1
SET CYSTO W/LG BORE CLAMP LF (SET/KITS/TRAYS/PACK) IMPLANT
SET IRRIG TUBING LAPAROSCOPIC (IRRIGATION / IRRIGATOR) ×2 IMPLANT
SET TRI-LUMEN FLTR TB AIRSEAL (TUBING) ×1 IMPLANT
STRIP CLOSURE SKIN 1/2X4 (GAUZE/BANDAGES/DRESSINGS) ×2 IMPLANT
SUT DVC VLOC 180 0 12IN GS21 (SUTURE) ×2
SUT MNCRL AB 3-0 PS2 27 (SUTURE) ×4 IMPLANT
SUT VIC AB 0 CT1 27 (SUTURE)
SUT VIC AB 0 CT1 27XBRD ANTBC (SUTURE) IMPLANT
SUT VIC AB 0 CT2 27 (SUTURE) IMPLANT
SUT VIC AB 2-0 CT2 27 (SUTURE) ×2 IMPLANT
SUT VICRYL 0 UR6 27IN ABS (SUTURE) ×4 IMPLANT
SUT VLOC 180 0 9IN  GS21 (SUTURE) ×2
SUT VLOC 180 0 9IN GS21 (SUTURE) ×1 IMPLANT
SUT VLOC 180 2-0 6IN GS21 (SUTURE) ×2 IMPLANT
SUTURE DVC VLC 180 0 12IN GS21 (SUTURE) IMPLANT
SYR 50ML LL SCALE MARK (SYRINGE) ×2 IMPLANT
SYRINGE 10CC LL (SYRINGE) ×1 IMPLANT
SYSTEM CONVERTIBLE TROCAR (TROCAR) ×1 IMPLANT
TIP UTERINE 5.1X6CM LAV DISP (MISCELLANEOUS) IMPLANT
TIP UTERINE 6.7X10CM GRN DISP (MISCELLANEOUS) ×1 IMPLANT
TIP UTERINE 6.7X6CM WHT DISP (MISCELLANEOUS) IMPLANT
TIP UTERINE 6.7X8CM BLUE DISP (MISCELLANEOUS) IMPLANT
TOWEL OR 17X24 6PK STRL BLUE (TOWEL DISPOSABLE) ×6 IMPLANT
TRAY FOLEY BAG SILVER LF 16FR (SET/KITS/TRAYS/PACK) ×2 IMPLANT
TROCAR 12M 150ML BLUNT (TROCAR) ×1 IMPLANT
TROCAR DISP BLADELESS 8 DVNC (TROCAR) IMPLANT
TROCAR DISP BLADELESS 8MM (TROCAR) ×1
TROCAR OPTI TIP 12M 100M (ENDOMECHANICALS) IMPLANT
TROCAR PORT AIRSEAL 5X120 (TROCAR) ×1 IMPLANT
TROCAR XCEL 12X100 BLDLESS (ENDOMECHANICALS) ×2 IMPLANT

## 2015-05-23 NOTE — Anesthesia Postprocedure Evaluation (Signed)
Anesthesia Post Note  Patient: Makayla Taylor  Procedure(s) Performed: Procedure(s) (LRB): ROBOTIC ASSISTED MYOMECTOMY WITH MORCELLATION (N/A)  Anesthesia type: general  Patient location: PACU  Post pain: Pain level controlled  Post assessment: Patient's Cardiovascular Status Stable  Last Vitals:  Filed Vitals:   05/23/15 1645  BP: 99/54  Pulse: 58  Temp:   Resp: 14    Post vital signs: Reviewed and stable  Level of consciousness: sedated  Complications: No apparent anesthesia complications

## 2015-05-23 NOTE — Transfer of Care (Signed)
Immediate Anesthesia Transfer of Care Note  Patient: Makayla Taylor  Procedure(s) Performed: Procedure(s): ROBOTIC ASSISTED MYOMECTOMY WITH MORCELLATION (N/A)  Patient Location: PACU  Anesthesia Type:General  Level of Consciousness: awake, alert  and oriented  Airway & Oxygen Therapy: Patient Spontanous Breathing and Patient connected to nasal cannula oxygen  Post-op Assessment: Report given to RN and Post -op Vital signs reviewed and stable  Post vital signs: Reviewed and stable  Last Vitals:  Filed Vitals:   05/23/15 1107  BP: 113/79  Pulse: 71  Temp: 36.8 C  Resp: 20    Complications: No apparent anesthesia complications

## 2015-05-23 NOTE — Op Note (Signed)
Preoperative diagnosis: Symptomatic uterine fibroids   Postoperative diagnosis: Same   Anesthesia: General   Anesthesiologist: Dr. Josephine Igo  Procedure: Robotically assisted myomectomy   Surgeon: Dr. Katharine Look Drewey Begue   Assistant: Earnstine Regal P.A.-C .  Estimated blood loss: minimal  Procedure:   After being informed of the planned procedure with possible complications including but not limited to bleeding, infection, injury to other organs, need for laparotomy, possible need for morcellation with risks and benefits reviewed, expected hospital stay and recovery, informed consent is obtained and patient is taken to or #7. She is placed in lithotomy position on a Pink pad with both arms padded and tucked on each side and bilateral knee-high sequential compressive devices. Because of history of previous failed intubation,she is given awake fiberoptic intubation without any complication. She is then given general anesthesia. She is prepped and draped in a sterile fashion. A three-way Foley catheter is inserted in her bladder.  Pelvic exam reveals: anteverted enlarged uterus to 14 week size with a dominant fundal fibroid. Adnexa are normal.  A weighted speculum is inserted in the vagina and the anterior lip of the cervix is grasped with a tenaculum forcep. We proceed with a paracervical block and vaginal infiltration using ropivacaine 0.5% diluted 1 in 1 with saline. The uterus was then sounded at 12 cm. We easily dilate the cervix using Hegar dilator to #27 which allows for easy placement of the intrauterine RUMI manipulator.  Trocar placement is decided. We infiltrate 2 cm above the umbilicus with 10 cc of ropivacaine per protocol and perform a 10 mm vertical incision which is brought down bluntly to the fascia. The fascia is identified and grasped with Coker forceps. The fascia is incised with Mayo scissors. Peritoneum is entered bluntly. A pursestring suture of 0 Vicryl is placed on the  fascia and a 10 mm Hassan trocar is easily inserted in the abdominal cavity held in placed with a Purstring suture. This allows for easy insufflation of a pneumoperitoneum using warmed CO2 at a maximum pressure of 15 mm of mercury. We then placed two 38mm robotic trocar on the left, one 79mm robotic trocar on the right and one 10 mm patient's side assistant trocar on the right after infiltrating every site with ropivacaine per protocol. The robot is docked on the left of the patient after positioning her in Trendelenburg. A monopolar scissor is inserted in arm #1, a PK gyrus forcep is inserted in arm #2 and a Tenaculum is inserted in arm #3.  Preparation and docking is completed in 40 minutes.   Observation: The uterus is enlarged with a predominant fundal fibroid measuring 9 cm. Both tubes and ovaries are normal except for previous tubal ligation. Both anterior and posterior cul-de-sac are normal. Liver is visualize and has small superficial hemangiomas. Appendix is not visualized.   We begin by infiltrating the fibroid days using vasopressin at a dilution of 20 units in 100 cc of saline until we obtain complete blanching using 30 cc.  The serosa is then opened vertically with open monopolar scissors. The fibroid is grasped with the Tenaculum and we proceed with blunt and sharp dissection until it is completely freed from the uterus. The specimen is placed in the posterior cul-de-sac. Instruments are then changed for a suture-cut in arm#1 and a long tip forceps in arm #2. We close the myometrial defect in 4 layers using a running suture of 0-v-Lock. The serosa is closed with a baseball suture of 2-0 V-Lock.  We irrigated  profusely to confirm a satisfactory hemostasis. A sheet of Interceed is positioned to cover the whole incision. Console time is completed in 45 minutes.   All instruments are then removed and the robot is undocked.   The patient side assistant 10 mm trocar is replaced with the  morcellator under direct visualization. We then proceed with systematic morcellation of the specimen which takes 17 minutes.   We irrigated profusely to again confirm a satisfactory hemostasis.   The fascia of the 12 mm right  Trocar is closed with a CT Fascial Closure device under direct visualization with 0-Vicryl. Then all trochars are removed under direct visualization after evacuating the pneumoperitoneum.   The fascia of the supraumbilical incision is closed with the previously placed pursestring suture of 0 Vicryl. All incisions are then closed with subcuticular suture of 3-0 Monocryl .   A speculum is inserted in the vagina to confirm adequate hemostasis on the cervix.   Instrument and sponge count is complete x2. Estimated blood loss is minimal.  The procedure is well tolerated by the patient who  is taken to recovery room in a well and stable condition.   Specimen: Fibroid sent to pathology.

## 2015-05-23 NOTE — Interval H&P Note (Signed)
History and Physical Interval Note:  05/23/2015 12:41 PM  Makayla Taylor  has presented today for surgery, with the diagnosis of Fibroids, Menorrhagia  The various methods of treatment have been discussed with the patient and family. After consideration of risks, benefits and other options for treatment, the patient has consented to  Procedure(s): ROBOTIC ASSISTED MYOMECTOMY (N/A) with morcellation as a surgical intervention .  The patient's history has been reviewed, patient examined, no change in status, stable for surgery.  I have reviewed the patient's chart and labs.  Questions were answered to the patient's satisfaction.     Jacqulynn Shappell A

## 2015-05-23 NOTE — Discharge Instructions (Signed)
POST-OPERATIVE INSTRUCTIONS TO PATIENT  Call [facility]  [phone])  for excessive pain, bleeding or temperature greater than or equal to 100.4 degrees (orally).    No driving for 1 week No lifting (more than 20 lbs) for 6 weeks No sexual activity for 2 weeks  Pain management:  Use Ibuprofen 600 mg every 6 hours for 5 days and then as needed. Use your pain medication as needed to maintain a pain level at or below 3/10 Use Colace 1-2 capsules per day as long as you are using pain medication to avoid constipation.       Diet: normal  Bathing: may shower day after surgery  Wound Care: keep incisions clean and dry  Return to Dr. Cletis Media on 06/07/15 at 9:00 am  Return to work: To be determined at post operative visit.    DVVOHY,WVPXTG A MD 05/23/2015 4:06 PM

## 2015-05-23 NOTE — Progress Notes (Signed)
robinual at 1130 was 0.2 mg not 0.1 per dr. Royce Macadamia.Marland Kitchen

## 2015-05-23 NOTE — H&P (View-Only) (Signed)
Makayla Taylor is a 44 y.o. female female P 2-0-0-2 presents for a robot assisted myomectomy with morcellation because of menorrhagia. The patient with know uterine fibroid, noticed that over the past year, her menstrual flow began to last 12-14 days. During this time she would pass significant sized clots and change her heavy flow pad every 2-3 hours. She reports some lower back discomfort with her bleeding, rated as 3/10 on a 10 point pain scale but no significant pelvic cramping. She denies any post coital bleeding, dyspareunia, changes in bowel function or bladder complaints. An endometrial biopys in November 2015 returned benign findings. A pelvic ultrasound/sono-hysterogram at the same time revealed: uterus: 8.11 x 6.82 x 7.54 cm, endometrium-0.688 cm and a left anterior intramural fibroid-5.6 x 5.4 x 6.4 cm that with saline infusion was shown not to have a submucosal component. Right ovary-2.84 x 1.99 x 2.12 cm and left ovary-2.76 x 1.93 x 1.44 cm. In addition the patient's TSH and CBC were normal. Over the course of the year the patient did not pursue medical management of her symptoms. After a review of both the medical and surgical mangaement options the patient has decided to proceed with myomectomy.  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  ROS: Denies corrective lenses, headache, vision changes, nasal congestion, dysphagia, tinnitus, dizziness, hoarseness, cough, chest pain, shortness of breath, nausea, vomiting, diarrhea,constipation, urinary frequency, urgency dysuria, hematuria, vaginitis symptoms, pelvic pain, swelling of joints,easy bruising, myalgias, arthralgias, skin rashes, unexplained weight loss and except as is mentioned in the history of present illness, patient's review of systems is otherwise negative.   Physical Exam  Bp: 112/58 P: 68 R: 17 Temperature: 98.6 degrees F orally Weight: 136 lbs. Height: 5\' 6"  BMI: 22  Neck: supple without masses or thyromegaly Lungs: clear to auscultation Heart: regular rate and rhythm Abdomen: soft, non-tender with firm mass from pelvis to 3 fingers above symphysis pubis; no organomegaly Pelvic:EGBUS- wnl; vagina-normal rugae; uterus-14 weeks size, cervix without lesions or motion tenderness; adnexae-no tenderness or masses Extremities: no clubbing, cyanosis or edema   Assesment: Symptomatic Fibroid  Menorrhagia   Disposition: Robotically-assisted myomectomy was reviewed with the patient.   Benefits of the robotic approach include lesser post-operative pain, less blood loss during surgery, reduced risk of injury to other organs due to better visualization with a 3-D HD 10 times magnifying camera, shorter hospital stay between 0-1 night and rapid recovery with return to daily routine in 2-3 weeks. Although robotically-assisted myomectomy has a longer operative  time than traditional laparotomy, in a patient with good medical history, the benefits usually outweigh the  risks.   Risks include but are not limited to bleeding, infection, injury to other organs, need for laparotomy and transient post-operative facial edema.   Patient informed about FDA warning on use of morcellator dated 04/15/2013.  Discussed:  1. Incidence of post-operative diagnosis of sarcoma in women undergoing a hysterectomy is 2:1000 2. Annual incidence of leiomyosarcoma is 0.64/100,000 women 3. Sarcomas have the highest incidence in women over 65 4. Power morcellation involves risks of spreading tissue / disease. In he case of undiagnosed cancer, it may adversely affect the patient's prognosis.  Also discussed:  1.Lack of tactile feedback with the robotic approach leaving non-visible fibroids impossible to resect with potential future growth.  2. Need for cesarean section if there is uterine cavity entry and/or multiple incisions  3. Risk of developing NEW fibroids estimated at 25 %  Patient voices understanding and desires to proceed as planned with a Robot Assist Laparoscopic Myomectomy with Morcellation at [facility] on May 23, 2015 at 12:30 p.m.          CSN# [id number]   Travious Vanover J. Florene Glen, PA-C  for Dr. Dede Query.Rivard

## 2015-05-24 ENCOUNTER — Encounter (HOSPITAL_COMMUNITY): Payer: Self-pay | Admitting: Obstetrics and Gynecology

## 2015-05-27 NOTE — Discharge Summary (Signed)
Physician Discharge Summary  Patient ID: Makayla Taylor MRN: [id number] DOB/AGE: 1971-07-02 44 y.o.  Admit date: 05/23/2015 Discharge date: 05/27/2015   Discharge Diagnoses:  Symptomatic Uterine Fibroid and Menorrhagia Active Problems:   Fibroid, uterine   Operation: Robot Assisted Laparoscopic Myomectomy with Morcellation   Discharged Condition: Good  Hospital Course: On the date of admission the patient underwent the aforementioned procedures and tolerated them well. Due to the patient's small airway she had to be intubated using conscious sedation.  Post operative course was unremarkable with the patient resuming bowel and bladder function by the evening of her surgery and was therefore deemed ready for discharge home.  Disposition: 01-Home or Self Care  Discharge Medications:    Medication List    TAKE these medications        ibuprofen 800 MG tablet  Commonly known as:  ADVIL,MOTRIN  1  PO  PC every 6 hours for 5 days then as needed for pain.     multivitamin with minerals tablet  Take 1 tablet by mouth daily.     oxyCODONE-acetaminophen 5-325 MG per tablet  Commonly known as:  PERCOCET/ROXICET  Take 1-2 tablets by mouth every 6 (six) hours as needed for severe pain.     triamcinolone cream 0.1 %  Commonly known as:  KENALOG  Apply 1 application topically 2 (two) times daily.          Follow-up: Dr. Katharine Look A. Rivard on June 07, 2015   Signed: Colvin Caroli 05/27/2015, 9:50 AM

## 2016-07-01 DIAGNOSIS — F330 Major depressive disorder, recurrent, mild: Secondary | ICD-10-CM | POA: Diagnosis not present

## 2016-07-01 DIAGNOSIS — Z1389 Encounter for screening for other disorder: Secondary | ICD-10-CM | POA: Diagnosis not present

## 2016-07-01 DIAGNOSIS — Z6820 Body mass index (BMI) 20.0-20.9, adult: Secondary | ICD-10-CM | POA: Diagnosis not present

## 2016-12-03 DIAGNOSIS — Z0000 Encounter for general adult medical examination without abnormal findings: Secondary | ICD-10-CM | POA: Diagnosis not present

## 2016-12-04 DIAGNOSIS — Z1231 Encounter for screening mammogram for malignant neoplasm of breast: Secondary | ICD-10-CM | POA: Diagnosis not present

## 2016-12-04 DIAGNOSIS — Z01419 Encounter for gynecological examination (general) (routine) without abnormal findings: Secondary | ICD-10-CM | POA: Diagnosis not present

## 2016-12-04 DIAGNOSIS — Z124 Encounter for screening for malignant neoplasm of cervix: Secondary | ICD-10-CM | POA: Diagnosis not present

## 2016-12-10 DIAGNOSIS — F330 Major depressive disorder, recurrent, mild: Secondary | ICD-10-CM | POA: Diagnosis not present

## 2016-12-10 DIAGNOSIS — Z0000 Encounter for general adult medical examination without abnormal findings: Secondary | ICD-10-CM | POA: Diagnosis not present

## 2016-12-10 DIAGNOSIS — Z6822 Body mass index (BMI) 22.0-22.9, adult: Secondary | ICD-10-CM | POA: Diagnosis not present

## 2016-12-10 DIAGNOSIS — Z1389 Encounter for screening for other disorder: Secondary | ICD-10-CM | POA: Diagnosis not present

## 2016-12-12 DIAGNOSIS — D1801 Hemangioma of skin and subcutaneous tissue: Secondary | ICD-10-CM | POA: Diagnosis not present

## 2016-12-12 DIAGNOSIS — L814 Other melanin hyperpigmentation: Secondary | ICD-10-CM | POA: Diagnosis not present

## 2016-12-12 DIAGNOSIS — D235 Other benign neoplasm of skin of trunk: Secondary | ICD-10-CM | POA: Diagnosis not present

## 2017-02-26 DIAGNOSIS — H6123 Impacted cerumen, bilateral: Secondary | ICD-10-CM | POA: Diagnosis not present

## 2017-12-11 DIAGNOSIS — L814 Other melanin hyperpigmentation: Secondary | ICD-10-CM | POA: Diagnosis not present

## 2017-12-11 DIAGNOSIS — L820 Inflamed seborrheic keratosis: Secondary | ICD-10-CM | POA: Diagnosis not present

## 2017-12-11 DIAGNOSIS — L821 Other seborrheic keratosis: Secondary | ICD-10-CM | POA: Diagnosis not present

## 2017-12-11 DIAGNOSIS — D1801 Hemangioma of skin and subcutaneous tissue: Secondary | ICD-10-CM | POA: Diagnosis not present

## 2017-12-11 DIAGNOSIS — D225 Melanocytic nevi of trunk: Secondary | ICD-10-CM | POA: Diagnosis not present

## 2018-01-06 DIAGNOSIS — Z0000 Encounter for general adult medical examination without abnormal findings: Secondary | ICD-10-CM | POA: Diagnosis not present

## 2018-01-09 DIAGNOSIS — Z1389 Encounter for screening for other disorder: Secondary | ICD-10-CM | POA: Diagnosis not present

## 2018-01-09 DIAGNOSIS — F330 Major depressive disorder, recurrent, mild: Secondary | ICD-10-CM | POA: Diagnosis not present

## 2018-01-09 DIAGNOSIS — Z0000 Encounter for general adult medical examination without abnormal findings: Secondary | ICD-10-CM | POA: Diagnosis not present

## 2018-01-09 DIAGNOSIS — Z6822 Body mass index (BMI) 22.0-22.9, adult: Secondary | ICD-10-CM | POA: Diagnosis not present

## 2018-02-04 DIAGNOSIS — Z1231 Encounter for screening mammogram for malignant neoplasm of breast: Secondary | ICD-10-CM | POA: Diagnosis not present

## 2018-02-04 DIAGNOSIS — Z01419 Encounter for gynecological examination (general) (routine) without abnormal findings: Secondary | ICD-10-CM | POA: Diagnosis not present

## 2018-02-04 DIAGNOSIS — Z6823 Body mass index (BMI) 23.0-23.9, adult: Secondary | ICD-10-CM | POA: Diagnosis not present

## 2018-02-04 DIAGNOSIS — Z9851 Tubal ligation status: Secondary | ICD-10-CM | POA: Diagnosis not present

## 2018-11-16 ENCOUNTER — Ambulatory Visit (INDEPENDENT_AMBULATORY_CARE_PROVIDER_SITE_OTHER): Payer: BLUE CROSS/BLUE SHIELD

## 2018-11-16 ENCOUNTER — Encounter: Payer: Self-pay | Admitting: Family Medicine

## 2018-11-16 ENCOUNTER — Ambulatory Visit (INDEPENDENT_AMBULATORY_CARE_PROVIDER_SITE_OTHER): Payer: BLUE CROSS/BLUE SHIELD | Admitting: Family Medicine

## 2018-11-16 VITALS — BP 102/80 | HR 70 | Temp 99.0°F | Ht 66.5 in | Wt 144.0 lb

## 2018-11-16 DIAGNOSIS — M25562 Pain in left knee: Secondary | ICD-10-CM | POA: Diagnosis not present

## 2018-11-16 MED ORDER — DICLOFENAC SODIUM 2 % TD SOLN: 1.0000 "application " | Freq: Two times a day (BID) | TRANSDERMAL | 3 refills | Status: DC

## 2018-11-16 MED ORDER — IBUPROFEN-FAMOTIDINE 800-26.6 MG PO TABS: 1.0000 | ORAL_TABLET | Freq: Three times a day (TID) | ORAL | 3 refills | Status: DC

## 2018-11-16 NOTE — Patient Instructions (Signed)
Nice to meet you  Please try th exercises  Please try compression and ice  Please take the duexis for 7-10 days and then use pennsaid  Please see me back in 3-4 weeks if there is no improvement.

## 2018-11-16 NOTE — Progress Notes (Signed)
Makayla Taylor - 47 y.o. female MRN [id number]  Date of birth: 06/03/1971  SUBJECTIVE:  Including CC & ROS.  Chief Complaint  Patient presents with  . Knee Pain    left knee 2 weeks     Makayla Taylor is a 47 y.o. female that is  Presenting with left knee pain. The pain started a couple of weeks ago. She felt her knee pain started after she twisted her knee. Has had swelling and pain on the medial joint line. No prior history of similar pain. Has not tried any medications. Pain is intermittent.    Review of Systems  Constitutional: Negative for fever.  HENT: Negative for congestion.   Cardiovascular: Negative for chest pain.  Gastrointestinal: Negative for abdominal pain.  Musculoskeletal: Positive for joint swelling.  Skin: Negative for color change.  Neurological: Negative for weakness.  Hematological: Negative for adenopathy.  Psychiatric/Behavioral: Negative for agitation.    HISTORY: Past Medical, Surgical, Social, and Family History Reviewed & Updated per EMR.   Pertinent Historical Findings include:  Past Medical History:  Diagnosis Date  . Complication of anesthesia    PT STATES "HISTORY OF DIFFICULT INTUBATION", Unable to intubate for surgery 02/2015, saw ENT, DR Royce Macadamia spoke with patient at 05/17/15  . Depression   . Guillain-Barre syndrome ([place])    no problems per patient at PAT appt 05/17/15  . Other pancytopenia ([place]) 04/19/2015  . SVD (spontaneous vaginal delivery)    x 1    Past Surgical History:  Procedure Laterality Date  . attempted myomectomy procedure  02/2015   not able to intubate  . CESAREAN SECTION     x 1  . ROBOT ASSISTED MYOMECTOMY N/A 05/23/2015   Procedure: ROBOTIC ASSISTED MYOMECTOMY WITH MORCELLATION;  Surgeon: Delsa Bern, MD;  Location: [facility];  Service: Gynecology;  Laterality: N/A;  . schwanoma    . TUBAL LIGATION    . WISDOM TOOTH EXTRACTION      Allergies  Allergen Reactions  . Latex Swelling  . Tape Rash    Bandaides, clear  adhesive    Family History  Problem Relation Age of Onset  . Melanoma Father      Social History   Socioeconomic History  . Marital status: Married    Spouse name: Not on file  . Number of children: Not on file  . Years of education: Not on file  . Highest education level: Not on file  Occupational History  . Not on file  Social Needs  . Financial resource strain: Not on file  . Food insecurity:    Worry: Not on file    Inability: Not on file  . Transportation needs:    Medical: Not on file    Non-medical: Not on file  Tobacco Use  . Smoking status: Never Smoker  . Smokeless tobacco: Never Used  Substance and Sexual Activity  . Alcohol use: Yes    Comment: socially  . Drug use: No  . Sexual activity: Yes    Birth control/protection: Surgical  Lifestyle  . Physical activity:    Days per week: Not on file    Minutes per session: Not on file  . Stress: Not on file  Relationships  . Social connections:    Talks on phone: Not on file    Gets together: Not on file    Attends religious service: Not on file    Active member of club or organization: Not on file    Attends meetings of  clubs or organizations: Not on file    Relationship status: Not on file  . Intimate partner violence:    Fear of current or ex partner: Not on file    Emotionally abused: Not on file    Physically abused: Not on file    Forced sexual activity: Not on file  Other Topics Concern  . Not on file  Social History Narrative  . Not on file     PHYSICAL EXAM:  VS: BP 102/80 (BP Location: Right Arm, Patient Position: Sitting, Cuff Size: Normal)   Pulse 70   Temp 99 F (37.2 C) (Oral)   Ht 5' 6.5" (1.689 m)   Wt 144 lb (65.3 kg)   SpO2 96%   BMI 22.89 kg/m  Physical Exam Gen: NAD, alert, cooperative with exam, well-appearing ENT: normal lips, normal nasal mucosa,  Eye: normal EOM, normal conjunctiva and lids CV:  no edema, +2 pedal pulses   Resp: no accessory muscle use,  non-labored,  Skin: no rashes, no areas of induration  Neuro: normal tone, normal sensation to touch Psych:  normal insight, alert and oriented MSK:  Left Knee: Normal to inspection with no erythema  Effusion present  Palpation normal with no warmth, Medial joint line tenderness. ROM full in flexion and extension and lower leg rotation. Ligaments with solid consistent endpoints including  LCL, MCL. Positive Thessalonian tests. Non painful patellar compression. Patellar glide without crepitus. Patellar and quadriceps tendons unremarkable. Hamstring and quadriceps strength is normal.  Neurovascularly intact   Limited ultrasound: left knee:  Mild effusion  Normal-appearing quadricep and patellar tendon. Normal-appearing lateral meniscus. Medial meniscus anterior horn appears to have a tear  Summary: Findings suggest medial meniscus tear.  Ultrasound and interpretation by Clearance Coots, MD       ASSESSMENT & PLAN:   Acute pain of left knee Findings suggestive of a meniscal tear.  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 -Pennsaid and Duexis. -Counseled on home exercise therapy and supportive care. -She bought a body helix. -If no improvement consider imaging or injection.  Discussed PRP as well.  Can consider physical therapy.

## 2018-11-17 DIAGNOSIS — M25562 Pain in left knee: Secondary | ICD-10-CM | POA: Insufficient documentation

## 2018-11-17 NOTE — Assessment & Plan Note (Signed)
Findings suggestive of a meniscal tear.  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 -Pennsaid and Duexis. -Counseled on home exercise therapy and supportive care. -She bought a body helix. -If no improvement consider imaging or injection.  Discussed PRP as well.  Can consider physical therapy.

## 2018-12-11 ENCOUNTER — Encounter: Payer: Self-pay | Admitting: Family Medicine

## 2018-12-11 ENCOUNTER — Ambulatory Visit (INDEPENDENT_AMBULATORY_CARE_PROVIDER_SITE_OTHER): Payer: BLUE CROSS/BLUE SHIELD | Admitting: Family Medicine

## 2018-12-11 DIAGNOSIS — M25562 Pain in left knee: Secondary | ICD-10-CM

## 2018-12-11 NOTE — Patient Instructions (Signed)
Good to see you  Please keep up with the exercises Try to increase your mileage no more than 10% at a time  Please ice as needed  Please send me a message in MyChart if any questions  Happy Holidays!

## 2018-12-11 NOTE — Progress Notes (Signed)
Makayla Taylor - 47 y.o. female MRN [id number]  Date of birth: 09/20/1971  SUBJECTIVE:  Including CC & ROS.  No chief complaint on file.   Makayla Taylor is a 47 y.o. female that is following up for her left knee pain.  She reports significant improvement in the knee.  Has been doing the home exercise therapy and wearing the compression.  She denies any mechanical symptoms.  Denies any swelling.  Feels like she is getting back to her normal self.  Feels like she is going to be able to start running again..   Review of Systems  Constitutional: Negative for fever.  HENT: Negative for congestion.   Respiratory: Negative for cough.   Cardiovascular: Negative for chest pain.  Gastrointestinal: Negative for abdominal pain.  Musculoskeletal: Negative for gait problem.  Skin: Negative for color change.  Neurological: Negative for weakness.  Hematological: Negative for adenopathy.  Psychiatric/Behavioral: Negative for agitation.    HISTORY: Past Medical, Surgical, Social, and Family History Reviewed & Updated per EMR.   Pertinent Historical Findings include:  Past Medical History:  Diagnosis Date  . Complication of anesthesia    PT STATES "HISTORY OF DIFFICULT INTUBATION", Unable to intubate for surgery 02/2015, saw ENT, DR Royce Macadamia spoke with patient at 05/17/15  . Depression   . Guillain-Barre syndrome ([place])    no problems per patient at PAT appt 05/17/15  . Other pancytopenia ([place]) 04/19/2015  . SVD (spontaneous vaginal delivery)    x 1    Past Surgical History:  Procedure Laterality Date  . attempted myomectomy procedure  02/2015   not able to intubate  . CESAREAN SECTION     x 1  . ROBOT ASSISTED MYOMECTOMY N/A 05/23/2015   Procedure: ROBOTIC ASSISTED MYOMECTOMY WITH MORCELLATION;  Surgeon: Delsa Bern, MD;  Location: [facility];  Service: Gynecology;  Laterality: N/A;  . schwanoma    . TUBAL LIGATION    . WISDOM TOOTH EXTRACTION      Allergies  Allergen Reactions  . Latex Swelling   . Tape Rash    Bandaides, clear adhesive    Family History  Problem Relation Age of Onset  . Melanoma Father      Social History   Socioeconomic History  . Marital status: Married    Spouse name: Not on file  . Number of children: Not on file  . Years of education: Not on file  . Highest education level: Not on file  Occupational History  . Not on file  Social Needs  . Financial resource strain: Not on file  . Food insecurity:    Worry: Not on file    Inability: Not on file  . Transportation needs:    Medical: Not on file    Non-medical: Not on file  Tobacco Use  . Smoking status: Never Smoker  . Smokeless tobacco: Never Used  Substance and Sexual Activity  . Alcohol use: Yes    Comment: socially  . Drug use: No  . Sexual activity: Yes    Birth control/protection: Surgical  Lifestyle  . Physical activity:    Days per week: Not on file    Minutes per session: Not on file  . Stress: Not on file  Relationships  . Social connections:    Talks on phone: Not on file    Gets together: Not on file    Attends religious service: Not on file    Active member of club or organization: Not on file  Attends meetings of clubs or organizations: Not on file    Relationship status: Not on file  . Intimate partner violence:    Fear of current or ex partner: Not on file    Emotionally abused: Not on file    Physically abused: Not on file    Forced sexual activity: Not on file  Other Topics Concern  . Not on file  Social History Narrative  . Not on file     PHYSICAL EXAM:  VS: BP 120/70   Pulse 68   Resp 16   Wt 144 lb (65.3 kg)   SpO2 98%   BMI 22.89 kg/m  Physical Exam Gen: NAD, alert, cooperative with exam, well-appearing ENT: normal lips, normal nasal mucosa,  Eye: normal EOM, normal conjunctiva and lids CV:  no edema, +2 pedal pulses   Resp: no accessory muscle use, non-labored,  Skin: no rashes, no areas of induration  Neuro: normal tone, normal  sensation to touch Psych:  normal insight, alert and oriented MSK:  Left knee:  No obvious effusion. Normal range of motion. Normal strength resistance. Negative McMurray's test. No significant tenderness palpation of the medial lateral joint line. Neurovascular intact     ASSESSMENT & PLAN:   Acute pain of left knee Has had improvement in her pain.  She feels like she may be able get back to running. -Continue home exercise program -May have acute flare going forward and discussed on how to manage that. -Could always get imaging or try physical therapy in the future.

## 2018-12-14 NOTE — Assessment & Plan Note (Signed)
Has had improvement in her pain.  She feels like she may be able get back to running. -Continue home exercise program -May have acute flare going forward and discussed on how to manage that. -Could always get imaging or try physical therapy in the future.

## 2019-01-01 DIAGNOSIS — H6123 Impacted cerumen, bilateral: Secondary | ICD-10-CM | POA: Diagnosis not present

## 2019-01-12 DIAGNOSIS — D1801 Hemangioma of skin and subcutaneous tissue: Secondary | ICD-10-CM | POA: Diagnosis not present

## 2019-01-12 DIAGNOSIS — B078 Other viral warts: Secondary | ICD-10-CM | POA: Diagnosis not present

## 2019-01-12 DIAGNOSIS — L84 Corns and callosities: Secondary | ICD-10-CM | POA: Diagnosis not present

## 2019-01-12 DIAGNOSIS — D225 Melanocytic nevi of trunk: Secondary | ICD-10-CM | POA: Diagnosis not present

## 2019-02-25 DIAGNOSIS — Z124 Encounter for screening for malignant neoplasm of cervix: Secondary | ICD-10-CM | POA: Diagnosis not present

## 2019-02-25 DIAGNOSIS — Z1231 Encounter for screening mammogram for malignant neoplasm of breast: Secondary | ICD-10-CM | POA: Diagnosis not present

## 2019-02-25 DIAGNOSIS — Z6823 Body mass index (BMI) 23.0-23.9, adult: Secondary | ICD-10-CM | POA: Diagnosis not present

## 2019-02-25 DIAGNOSIS — Z01419 Encounter for gynecological examination (general) (routine) without abnormal findings: Secondary | ICD-10-CM | POA: Diagnosis not present

## 2019-10-06 ENCOUNTER — Other Ambulatory Visit: Payer: Self-pay | Admitting: Obstetrics and Gynecology

## 2020-07-04 DIAGNOSIS — Z0000 Encounter for general adult medical examination without abnormal findings: Secondary | ICD-10-CM | POA: Diagnosis not present

## 2020-07-05 DIAGNOSIS — J029 Acute pharyngitis, unspecified: Secondary | ICD-10-CM | POA: Diagnosis not present

## 2020-07-05 DIAGNOSIS — J069 Acute upper respiratory infection, unspecified: Secondary | ICD-10-CM | POA: Diagnosis not present

## 2020-07-05 DIAGNOSIS — R05 Cough: Secondary | ICD-10-CM | POA: Diagnosis not present

## 2020-07-11 DIAGNOSIS — L309 Dermatitis, unspecified: Secondary | ICD-10-CM | POA: Diagnosis not present

## 2020-07-11 DIAGNOSIS — Z8742 Personal history of other diseases of the female genital tract: Secondary | ICD-10-CM | POA: Diagnosis not present

## 2020-07-11 DIAGNOSIS — F330 Major depressive disorder, recurrent, mild: Secondary | ICD-10-CM | POA: Diagnosis not present

## 2020-07-11 DIAGNOSIS — R400 Somnolence: Secondary | ICD-10-CM | POA: Diagnosis not present

## 2020-07-11 DIAGNOSIS — Z1331 Encounter for screening for depression: Secondary | ICD-10-CM | POA: Diagnosis not present

## 2020-07-11 DIAGNOSIS — Z0000 Encounter for general adult medical examination without abnormal findings: Secondary | ICD-10-CM | POA: Diagnosis not present

## 2020-09-01 ENCOUNTER — Encounter: Payer: Self-pay | Admitting: Internal Medicine

## 2020-10-04 ENCOUNTER — Telehealth: Payer: Self-pay | Admitting: *Deleted

## 2020-10-04 ENCOUNTER — Other Ambulatory Visit: Payer: Self-pay

## 2020-10-04 DIAGNOSIS — Z1211 Encounter for screening for malignant neoplasm of colon: Secondary | ICD-10-CM

## 2020-10-04 NOTE — Telephone Encounter (Signed)
I notified the patient of Dr.Perry's recommendations. Gave her the appointment information for [facility] and covid screening. previsit rescheduled closer to colon date. Pt is aware of new dates/times.

## 2020-10-04 NOTE — Telephone Encounter (Signed)
I will defer to Mr. Despina Hick.  Thank you

## 2020-10-04 NOTE — Telephone Encounter (Signed)
Pt scheduled for covid test 11/30/20@9 :30am, colon scheduled at [facility] 12/04/20@11 :15am.

## 2020-10-04 NOTE — Telephone Encounter (Signed)
Makayla Taylor,  This pt is a documented difficult intubation and needs to have her procedure done at the hospital.  Thanks,  Osvaldo Angst

## 2020-10-04 NOTE — Telephone Encounter (Signed)
Please review. Patient is scheduled for direct screening colonoscopy. Per chart "difficult intubation" . [place] for [employer]? Please advise. Thank you, Shahla Betsill pv

## 2020-10-24 DIAGNOSIS — M542 Cervicalgia: Secondary | ICD-10-CM | POA: Diagnosis not present

## 2020-10-24 DIAGNOSIS — R221 Localized swelling, mass and lump, neck: Secondary | ICD-10-CM | POA: Diagnosis not present

## 2020-10-26 ENCOUNTER — Encounter: Payer: BLUE CROSS/BLUE SHIELD | Admitting: Internal Medicine

## 2020-11-06 DIAGNOSIS — M9902 Segmental and somatic dysfunction of thoracic region: Secondary | ICD-10-CM | POA: Diagnosis not present

## 2020-11-06 DIAGNOSIS — M542 Cervicalgia: Secondary | ICD-10-CM | POA: Diagnosis not present

## 2020-11-06 DIAGNOSIS — M9901 Segmental and somatic dysfunction of cervical region: Secondary | ICD-10-CM | POA: Diagnosis not present

## 2020-11-15 DIAGNOSIS — M542 Cervicalgia: Secondary | ICD-10-CM | POA: Diagnosis not present

## 2020-11-15 DIAGNOSIS — M9902 Segmental and somatic dysfunction of thoracic region: Secondary | ICD-10-CM | POA: Diagnosis not present

## 2020-11-15 DIAGNOSIS — M9901 Segmental and somatic dysfunction of cervical region: Secondary | ICD-10-CM | POA: Diagnosis not present

## 2020-11-20 ENCOUNTER — Encounter: Payer: Self-pay | Admitting: Internal Medicine

## 2020-11-20 ENCOUNTER — Other Ambulatory Visit: Payer: Self-pay

## 2020-11-20 ENCOUNTER — Ambulatory Visit (AMBULATORY_SURGERY_CENTER): Payer: Self-pay | Admitting: *Deleted

## 2020-11-20 VITALS — Ht 66.5 in | Wt 148.0 lb

## 2020-11-20 DIAGNOSIS — Z1211 Encounter for screening for malignant neoplasm of colon: Secondary | ICD-10-CM

## 2020-11-20 DIAGNOSIS — Z01818 Encounter for other preprocedural examination: Secondary | ICD-10-CM

## 2020-11-20 MED ORDER — SUTAB 1479-225-188 MG PO TABS: 1.0000 | ORAL_TABLET | ORAL | 0 refills | Status: AC

## 2020-11-20 NOTE — Progress Notes (Signed)
Patient is here in-person for PV. Patient denies any allergies to eggs or soy. Patient does have  problems with anesthesia. PATIENT REQUIRES FLEXIABLE ET TUBE IF NEEDED.  Patient denies any oxygen use at home. Patient denies taking any diet/weight loss medications or blood thinners. Patient is not being treated for MRSA or C-diff. Patient is aware of our care-partner policy and [id number] safety protocol. EMMI education assigned to the patient for the procedure, sent to [place].   COVID-19 test is on 12/2 at 930 am-pt is aware.   Prep Prescription coupon given to the patient.

## 2020-11-22 ENCOUNTER — Encounter (HOSPITAL_COMMUNITY): Payer: Self-pay | Admitting: Internal Medicine

## 2020-11-22 ENCOUNTER — Other Ambulatory Visit: Payer: Self-pay

## 2020-11-27 DIAGNOSIS — M9901 Segmental and somatic dysfunction of cervical region: Secondary | ICD-10-CM | POA: Diagnosis not present

## 2020-11-27 DIAGNOSIS — M9902 Segmental and somatic dysfunction of thoracic region: Secondary | ICD-10-CM | POA: Diagnosis not present

## 2020-11-27 DIAGNOSIS — M542 Cervicalgia: Secondary | ICD-10-CM | POA: Diagnosis not present

## 2020-11-30 ENCOUNTER — Other Ambulatory Visit (HOSPITAL_COMMUNITY)
Admission: RE | Admit: 2020-11-30 | Discharge: 2020-11-30 | Disposition: A | Discharge status: Home / Self Care | Payer: BC Managed Care – PPO | Source: Ambulatory Visit | Attending: Internal Medicine | Admitting: Internal Medicine

## 2020-11-30 DIAGNOSIS — Z20822 Contact with and (suspected) exposure to covid-19: Secondary | ICD-10-CM | POA: Diagnosis not present

## 2020-11-30 DIAGNOSIS — Z01812 Encounter for preprocedural laboratory examination: Secondary | ICD-10-CM | POA: Insufficient documentation

## 2020-11-30 LAB — SARS CORONAVIRUS 2 (TAT 6-24 HRS): SARS Coronavirus 2: NEGATIVE

## 2020-12-03 NOTE — Anesthesia Preprocedure Evaluation (Addendum)
Anesthesia Evaluation  Patient identified by MRN, date of birth, ID band Patient awake    Reviewed: Allergy & Precautions, NPO status , Patient's Chart, lab work & pertinent test results  History of Anesthesia Complications (+) DIFFICULT AIRWAY and history of anesthetic complications  Airway Mallampati: III  TM Distance: <3 FB Neck ROM: Full  Mouth opening: Limited Mouth Opening  Dental no notable dental hx. (+) Teeth Intact, Dental Advisory Given   Pulmonary neg pulmonary ROS,    Pulmonary exam normal breath sounds clear to auscultation       Cardiovascular negative cardio ROS Normal cardiovascular exam Rhythm:Regular Rate:Normal     Neuro/Psych PSYCHIATRIC DISORDERS Depression    GI/Hepatic negative GI ROS, Neg liver ROS,   Endo/Other  negative endocrine ROS  Renal/GU negative Renal ROS     Musculoskeletal negative musculoskeletal ROS (+)   Abdominal   Peds  Hematology  (+) anemia ,   Anesthesia Other Findings   Reproductive/Obstetrics                            Anesthesia Physical Anesthesia Plan  ASA: II  Anesthesia Plan: MAC   Post-op Pain Management:    Induction: Intravenous  PONV Risk Score and Plan: 2 and Treatment may vary due to age or medical condition  Airway Management Planned: Natural Airway and Nasal Cannula  Additional Equipment: None  Intra-op Plan:   Post-operative Plan:   Informed Consent: I have reviewed the patients History and Physical, chart, labs and discussed the procedure including the risks, benefits and alternatives for the proposed anesthesia with the patient or authorized representative who has indicated his/her understanding and acceptance.     Dental advisory given  Plan Discussed with: CRNA and Anesthesiologist  Anesthesia Plan Comments: ([facility] screening)       Anesthesia Quick Evaluation

## 2020-12-04 ENCOUNTER — Other Ambulatory Visit: Payer: Self-pay

## 2020-12-04 ENCOUNTER — Encounter (HOSPITAL_COMMUNITY): Payer: Self-pay | Admitting: Internal Medicine

## 2020-12-04 ENCOUNTER — Ambulatory Visit (HOSPITAL_COMMUNITY): Payer: BC Managed Care – PPO | Admitting: Anesthesiology

## 2020-12-04 ENCOUNTER — Encounter (HOSPITAL_COMMUNITY)
Admission: RE | Disposition: A | Discharge status: Home / Self Care | Payer: Self-pay | Source: Home / Self Care | Attending: Internal Medicine

## 2020-12-04 ENCOUNTER — Ambulatory Visit (HOSPITAL_COMMUNITY)
Admission: RE | Admit: 2020-12-04 | Discharge: 2020-12-04 | Disposition: A | Discharge status: Home / Self Care | Payer: BC Managed Care – PPO | Attending: Internal Medicine | Admitting: Internal Medicine

## 2020-12-04 DIAGNOSIS — Z1211 Encounter for screening for malignant neoplasm of colon: Secondary | ICD-10-CM

## 2020-12-04 DIAGNOSIS — D630 Anemia in neoplastic disease: Secondary | ICD-10-CM | POA: Diagnosis not present

## 2020-12-04 DIAGNOSIS — D259 Leiomyoma of uterus, unspecified: Secondary | ICD-10-CM | POA: Diagnosis not present

## 2020-12-04 DIAGNOSIS — D120 Benign neoplasm of cecum: Secondary | ICD-10-CM | POA: Diagnosis not present

## 2020-12-04 HISTORY — PX: POLYPECTOMY: SHX5525

## 2020-12-04 HISTORY — PX: COLONOSCOPY WITH PROPOFOL: SHX5780

## 2020-12-04 LAB — PREGNANCY, URINE: Preg Test, Ur: NEGATIVE

## 2020-12-04 SURGERY — COLONOSCOPY WITH PROPOFOL
Anesthesia: Monitor Anesthesia Care

## 2020-12-04 MED ORDER — PROPOFOL 500 MG/50ML IV EMUL
INTRAVENOUS | Status: DC | PRN
  Administered 2020-12-04: 50 mg via INTRAVENOUS
  Administered 2020-12-04: 100 ug/kg/min via INTRAVENOUS

## 2020-12-04 MED ORDER — LACTATED RINGERS IV SOLN: Freq: Once | INTRAVENOUS | Status: AC

## 2020-12-04 MED ORDER — SODIUM CHLORIDE 0.9 % IV SOLN: INTRAVENOUS | Status: DC

## 2020-12-04 MED ORDER — LACTATED RINGERS IV SOLN: INTRAVENOUS | Status: DC | PRN

## 2020-12-04 SURGICAL SUPPLY — 22 items

## 2020-12-04 NOTE — Anesthesia Postprocedure Evaluation (Signed)
Anesthesia Post Note  Patient: Makayla Taylor  Procedure(s) Performed: COLONOSCOPY WITH PROPOFOL (N/A ) POLYPECTOMY     Patient location during evaluation: Endoscopy Anesthesia Type: MAC Level of consciousness: awake and alert Pain management: pain level controlled Vital Signs Assessment: post-procedure vital signs reviewed and stable Respiratory status: spontaneous breathing, nonlabored ventilation, respiratory function stable and patient connected to nasal cannula oxygen Cardiovascular status: blood pressure returned to baseline and stable Postop Assessment: no apparent nausea or vomiting Anesthetic complications: no   No complications documented.  Last Vitals:  Vitals:   12/04/20 1012 12/04/20 1127  BP: 123/69 (!) 118/57  Pulse:  70  Resp: 12 15  Temp: 36.6 C (!) 36.1 C  SpO2: 99% 100%    Last Pain:  Vitals:   12/04/20 1127  TempSrc: Temporal  PainSc:                  Barnet [place]

## 2020-12-04 NOTE — Transfer of Care (Signed)
Immediate Anesthesia Transfer of Care Note  Patient: Makayla Taylor  Procedure(s) Performed: Procedure(s): COLONOSCOPY WITH PROPOFOL (N/A)  Patient Location: PACU and Endoscopy Unit  Anesthesia Type:MAC  Level of Consciousness: awake, alert  and oriented  Airway & Oxygen Therapy: Patient Spontanous Breathing and Patient connected to nasal cannula oxygen  Post-op Assessment: Report given to RN and Post -op Vital signs reviewed and stable  Post vital signs: Reviewed and stable  Last Vitals:  Vitals:   12/04/20 1012 12/04/20 1127  BP: 123/69   Pulse:  70  Resp: 12 15  Temp: 36.6 C   SpO2: 93% 570%    Complications: No apparent anesthesia complications

## 2020-12-04 NOTE — Discharge Instructions (Signed)

## 2020-12-04 NOTE — H&P (Signed)
HISTORY OF PRESENT ILLNESS:  Makayla Taylor is a 49 y.o. female, [employer], who presents today for routine screening colonoscopy. She is at average risk for colorectal neoplasia. No family history of colon cancer. GI review of systems is negative. This is her index examination. She did well with her prep. Her examination is being scheduled at the hospital with monitored anesthesia care due to a reported history of difficult intubation.  REVIEW OF SYSTEMS:  All non-GI ROS negative.  Past Medical History:  Diagnosis Date  . Complication of anesthesia    PT STATES "HISTORY OF DIFFICULT INTUBATION", will need flexiable ET tube/Unable to intubate for surgery 02/2015, saw ENT, DR Royce Macadamia spoke with patient at 05/17/15  . Depression   . 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 Heart murmur   . Other pancytopenia ([place]) 04/19/2015  . SVD (spontaneous vaginal delivery)    x 1    Past Surgical History:  Procedure Laterality Date  . attempted myomectomy procedure  02/2015   not able to intubate  . CESAREAN SECTION     x 1  . ROBOT ASSISTED MYOMECTOMY N/A 05/23/2015   Procedure: ROBOTIC ASSISTED MYOMECTOMY WITH MORCELLATION;  Surgeon: Delsa Bern, MD;  Location: [facility];  Service: Gynecology;  Laterality: N/A;  . schwanoma    . TUBAL LIGATION    . WISDOM TOOTH EXTRACTION      Social History [name]  reports that she has never smoked. She has never used smokeless tobacco. She reports current alcohol use of about 5.0 standard drinks of alcohol per week. She reports that she does not use drugs.  family history includes Melanoma in her father.  Allergies  Allergen Reactions  . Bacitracin Rash  . Gold Sodium Thiomalate Rash  . Nickel Rash  . Latex Swelling and Rash  . Tape Rash    Bandaides, clear adhesive       PHYSICAL EXAMINATION: Vital signs: BP 123/69   Temp 97.9 F (36.6 C) (Temporal)   Resp 12   Ht 5'  7" (1.702 m)   Wt 63.5 kg   LMP 11/27/2020   SpO2 99%   BMI 21.93 kg/m   Constitutional: generally well-appearing, no acute distress Psychiatric: alert and oriented x3, cooperative Eyes: Anicteric Mouth: oral pharynx moist, no lesions Cardiovascular: heart regular rate and rhythm, no murmur Lungs: clear to auscultation bilaterally Abdomen: soft, nontender, nondistended, no obvious ascites, no peritoneal signs, normal bowel sounds, no organomegaly Rectal: Deferred until colonoscopy Extremities: no clubbing, cyanosis, or lower extremity edema bilaterally Skin: no lesions on visible extremities Neuro: No focal deficits.   ASSESSMENT:  1. Colon cancer screening. Average risk 2. Reported difficult airway   PLAN:  1. Colonoscopy with polypectomy if indicated.The nature of the procedure, as well as the risks, benefits, and alternatives were carefully and thoroughly reviewed with the patient. Ample time for discussion and questions allowed. The patient understood, was satisfied, and agreed to proceed.

## 2020-12-04 NOTE — Op Note (Signed)
[facility] Patient Name: Makayla Taylor Procedure Date: 12/04/2020 MRN: [id number] Attending MD: Docia Chuck. Henrene Pastor , MD Date of Birth: April 22, 1971 CSN: [id number] Age: 49 Admit Type: Outpatient Procedure:                Colonoscopy with cold snare polypectomy x 1 Indications:              Screening for colorectal malignant neoplasm Providers:                Docia Chuck. Henrene Pastor, MD, Wynonia Sours, RN, Cletis Athens,                            Technician Referring MD:             Velna Hatchet, MD Medicines:                Monitored Anesthesia Care Complications:            No immediate complications. Estimated blood loss:                            None. Estimated Blood Loss:     Estimated blood loss: none. Procedure:                Pre-Anesthesia Assessment:                           - Prior to the procedure, a History and Physical                            was performed, and patient medications and                            allergies were reviewed. The patient's tolerance of                            previous anesthesia was also reviewed. The risks                            and benefits of the procedure and the sedation                            options and risks were discussed with the patient.                            All questions were answered, and informed consent                            was obtained. Prior Anticoagulants: The patient has                            taken no previous anticoagulant or antiplatelet                            agents. ASA Grade Assessment: I - A normal, healthy  patient. After reviewing the risks and benefits,                            the patient was deemed in satisfactory condition to                            undergo the procedure.                           After obtaining informed consent, the colonoscope                            was passed under direct vision. Throughout the                             procedure, the patient's blood pressure, pulse, and                            oxygen saturations were monitored continuously. The                            CF-HQ190L ([id number]) Olympus colonoscope was                            introduced through the anus and advanced to the the                            cecum, identified by appendiceal orifice and                            ileocecal valve. The ileocecal valve, appendiceal                            orifice, and rectum were photographed. The quality                            of the bowel preparation was excellent. The                            colonoscopy was performed without difficulty. The                            patient tolerated the procedure well. The bowel                            preparation used was SUPREP via split dose                            instruction. Scope In: 11:04:46 AM Scope Out: 11:22:44 AM Total Procedure Duration: 0 hours 17 minutes 58 seconds  Findings:      A 5 mm polyp was found in the cecum. The polyp was sessile. The polyp       was removed with a cold snare. Resection and retrieval were complete.      The exam  was otherwise without abnormality on direct and retroflexion       views. Impression:               - One 5 mm polyp in the cecum, removed with a cold                            snare. Resected and retrieved.                           - The examination was otherwise normal on direct                            and retroflexion views. Moderate Sedation:      none Recommendation:           - Repeat colonoscopy in 5 or 10 years for                            surveillance, based on final pathology.                           - Patient has a contact number available for                            emergencies. The signs and symptoms of potential                            delayed complications were discussed with the                            patient. Return to normal activities tomorrow.                             Written discharge instructions were provided to the                            patient.                           - Resume previous diet.                           - Continue present medications.                           - Await pathology results. Procedure Code(s):        --- Professional ---                           [phone], Colonoscopy, flexible; with removal of                            tumor(s), polyp(s), or other lesion(s) by snare                            technique Diagnosis Code(s):        ---  Professional ---                           Z12.11, Encounter for screening for malignant                            neoplasm of colon                           K63.5, Polyp of colon CPT copyright 2019 [employer]. All rights reserved. The codes documented in this report are preliminary and upon coder review may  be revised to meet current compliance requirements. Docia Chuck. Henrene Pastor, MD 12/04/2020 11:30:05 AM This report has been signed electronically. Number of Addenda: 0

## 2020-12-05 LAB — SURGICAL PATHOLOGY

## 2020-12-05 NOTE — Progress Notes (Signed)
Attempted, unable to leave message/bt 

## 2020-12-06 ENCOUNTER — Telehealth: Payer: Self-pay | Admitting: Internal Medicine

## 2020-12-08 ENCOUNTER — Encounter (HOSPITAL_COMMUNITY): Payer: Self-pay | Admitting: Internal Medicine

## 2021-01-03 DIAGNOSIS — M542 Cervicalgia: Secondary | ICD-10-CM | POA: Diagnosis not present

## 2021-01-03 DIAGNOSIS — M9902 Segmental and somatic dysfunction of thoracic region: Secondary | ICD-10-CM | POA: Diagnosis not present

## 2021-01-03 DIAGNOSIS — M9901 Segmental and somatic dysfunction of cervical region: Secondary | ICD-10-CM | POA: Diagnosis not present

## 2021-01-25 DIAGNOSIS — L821 Other seborrheic keratosis: Secondary | ICD-10-CM | POA: Diagnosis not present

## 2021-01-25 DIAGNOSIS — D225 Melanocytic nevi of trunk: Secondary | ICD-10-CM | POA: Diagnosis not present

## 2021-01-25 DIAGNOSIS — L309 Dermatitis, unspecified: Secondary | ICD-10-CM | POA: Diagnosis not present

## 2021-01-25 DIAGNOSIS — L814 Other melanin hyperpigmentation: Secondary | ICD-10-CM | POA: Diagnosis not present

## 2021-04-23 ENCOUNTER — Ambulatory Visit (INDEPENDENT_AMBULATORY_CARE_PROVIDER_SITE_OTHER): Payer: BC Managed Care – PPO | Admitting: Otolaryngology

## 2021-04-23 ENCOUNTER — Encounter (INDEPENDENT_AMBULATORY_CARE_PROVIDER_SITE_OTHER): Payer: Self-pay | Admitting: Otolaryngology

## 2021-04-23 ENCOUNTER — Other Ambulatory Visit: Payer: Self-pay

## 2021-04-23 VITALS — Temp 97.3°F

## 2021-04-23 DIAGNOSIS — H6122 Impacted cerumen, left ear: Secondary | ICD-10-CM

## 2021-04-23 NOTE — Progress Notes (Signed)
HPI: Makayla Taylor is a 50 y.o. female who presents for evaluation of wax buildup in the left ear that is obstructed.  She is doing well on the right side..  Past Medical History:  Diagnosis Date  . Complication of anesthesia    PT STATES "HISTORY OF DIFFICULT INTUBATION", will need flexiable ET tube/Unable to intubate for surgery 02/2015, saw ENT, DR Royce Macadamia spoke with patient at 05/17/15  . Depression   . 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 Heart murmur   . Other pancytopenia ([place]) 04/19/2015  . SVD (spontaneous vaginal delivery)    x 1   Past Surgical History:  Procedure Laterality Date  . attempted myomectomy procedure  02/2015   not able to intubate  . CESAREAN SECTION     x 1  . COLONOSCOPY WITH PROPOFOL N/A 12/04/2020   Procedure: COLONOSCOPY WITH PROPOFOL;  Surgeon: Irene Shipper, MD;  Location: [facility];  Service: Endoscopy;  Laterality: N/A;  . POLYPECTOMY  12/04/2020   Procedure: POLYPECTOMY;  Surgeon: Irene Shipper, MD;  Location: [facility];  Service: Endoscopy;;  . ROBOT ASSISTED MYOMECTOMY N/A 05/23/2015   Procedure: ROBOTIC ASSISTED MYOMECTOMY WITH MORCELLATION;  Surgeon: Delsa Bern, MD;  Location: [facility];  Service: Gynecology;  Laterality: N/A;  . schwanoma    . TUBAL LIGATION    . WISDOM TOOTH EXTRACTION     Social History   Socioeconomic History  . Marital status: Married    Spouse name: Not on file  . Number of children: Not on file  . Years of education: Not on file  . Highest education level: Not on file  Occupational History  . Not on file  Tobacco Use  . Smoking status: Never Smoker  . Smokeless tobacco: Never Used  Vaping Use  . Vaping Use: Never used  Substance and Sexual Activity  . Alcohol use: Yes    Alcohol/week: 5.0 standard drinks    Types: 5 Glasses of wine per week    Comment: socially  . Drug use: No  . Sexual activity: Yes    Birth control/protection: Surgical  Other Topics Concern   . Not on file  Social History Narrative  . Not on file   Social Determinants of Health   Financial Resource Strain: Not on file  Food Insecurity: Not on file  Transportation Needs: Not on file  Physical Activity: Not on file  Stress: Not on file  Social Connections: Not on file   Family History  Problem Relation Age of Onset  . Melanoma Father   . Colon cancer Neg Hx   . Esophageal cancer Neg Hx   . Rectal cancer Neg Hx   . Stomach cancer Neg Hx   . Colon polyps Neg Hx    Allergies  Allergen Reactions  . Bacitracin Rash  . Gold Sodium Thiomalate Rash  . Nickel Rash  . Latex Swelling and Rash  . Tape Rash    Bandaides, clear adhesive   Prior to Admission medications   Medication Sig Start Date End Date Taking? Authorizing Provider  escitalopram (LEXAPRO) 20 MG tablet Take 20 mg by mouth at bedtime.  10/28/18   [provider]  LO LOESTRIN FE 1 MG-10 MCG / 10 MCG tablet Take 1 tablet by mouth daily. 11/01/20   [provider]  Multiple Vitamins-Minerals (MULTIVITAMIN WITH MINERALS) tablet Take 1 tablet by mouth daily.    [provider]  Sodium Sulfate-Mag  Sulfate-KCl (SUTAB) [id number] MG TABS Take 1 kit by mouth as directed. 11/20/20   Irene Shipper, MD  triamcinolone (KENALOG) 0.1 % Apply 1 application topically daily as needed (Itching).  10/02/20   [provider]     Positive ROS: Otherwise negative  All other systems have been reviewed and were otherwise negative with the exception of those mentioned in the HPI and as above.  Physical Exam: Constitutional: Alert, well-appearing, no acute distress Ears: External ears without lesions or tenderness. Ear canals right ear canal is clear.  Left ear canal was occluded with cerumen that was removed with a curette.. Nasal: External nose without lesions. Clear nasal passages Oral: Oropharynx clear. Neck: No palpable adenopathy or masses Respiratory: Breathing comfortably  Skin: No  facial/neck lesions or rash noted.  Cerumen impaction removal  Date/Time: 04/23/2021 4:22 PM Performed by: Rozetta Nunnery, MD Authorized by: Rozetta Nunnery, MD   Consent:    Consent obtained:  Verbal   Consent given by:  Patient   Risks discussed:  Pain and bleeding Procedure details:    Location:  L ear   Procedure type: curette   Post-procedure details:    Inspection:  TM intact and canal normal   Hearing quality:  Improved   Patient tolerance of procedure:  Tolerated well, no immediate complications Comments:     Left ear canal was occluded with cerumen that was was removed with a curette.  TMs are clear bilaterally.    Assessment: Left ear cerumen impaction  Plan: This was cleaned in the office. She will follow-up as needed.  Radene Journey, MD

## 2024-08-13 DIAGNOSIS — Z79899 Other long term (current) drug therapy: Secondary | ICD-10-CM | POA: Diagnosis not present

## 2024-08-18 DIAGNOSIS — Z0000 Encounter for general adult medical examination without abnormal findings: Secondary | ICD-10-CM | POA: Diagnosis not present

## 2024-08-18 DIAGNOSIS — Z1331 Encounter for screening for depression: Secondary | ICD-10-CM | POA: Diagnosis not present

## 2024-08-18 DIAGNOSIS — Z1339 Encounter for screening examination for other mental health and behavioral disorders: Secondary | ICD-10-CM | POA: Diagnosis not present

## 2024-09-29 DIAGNOSIS — M62830 Muscle spasm of back: Secondary | ICD-10-CM | POA: Diagnosis not present

## 2024-09-29 DIAGNOSIS — T148XXA Other injury of unspecified body region, initial encounter: Secondary | ICD-10-CM | POA: Diagnosis not present

## 2024-10-19 DIAGNOSIS — M545 Low back pain, unspecified: Secondary | ICD-10-CM | POA: Diagnosis not present
